# Patient Record
Sex: Female | Born: 1995 | Race: White | Hispanic: No | Marital: Married | Smoking: Current some day smoker
Health system: Southern US, Community
[De-identification: ages and names within clinical notes are randomized; demographics above are authoritative.]

## PROBLEM LIST (undated history)

## (undated) ENCOUNTER — Inpatient Hospital Stay (HOSPITAL_COMMUNITY): Payer: Self-pay

## (undated) DIAGNOSIS — J45909 Unspecified asthma, uncomplicated: Secondary | ICD-10-CM

## (undated) DIAGNOSIS — Z8619 Personal history of other infectious and parasitic diseases: Secondary | ICD-10-CM

## (undated) DIAGNOSIS — R51 Headache: Secondary | ICD-10-CM

## (undated) DIAGNOSIS — F419 Anxiety disorder, unspecified: Secondary | ICD-10-CM

## (undated) DIAGNOSIS — F329 Major depressive disorder, single episode, unspecified: Secondary | ICD-10-CM

## (undated) DIAGNOSIS — F32A Depression, unspecified: Secondary | ICD-10-CM

## (undated) DIAGNOSIS — T7840XA Allergy, unspecified, initial encounter: Secondary | ICD-10-CM

## (undated) DIAGNOSIS — R519 Headache, unspecified: Secondary | ICD-10-CM

## (undated) HISTORY — PX: TONSILLECTOMY: SUR1361

## (undated) HISTORY — DX: Depression, unspecified: F32.A

## (undated) HISTORY — DX: Personal history of other infectious and parasitic diseases: Z86.19

## (undated) HISTORY — DX: Anxiety disorder, unspecified: F41.9

## (undated) HISTORY — DX: Allergy, unspecified, initial encounter: T78.40XA

---
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
## 2017-04-15 ENCOUNTER — Encounter: Payer: Self-pay | Admitting: Emergency Medicine

## 2017-04-15 ENCOUNTER — Emergency Department
Admission: EM | Admit: 2017-04-15 | Discharge: 2017-04-15 | Disposition: A | Discharge status: Home / Self Care | Payer: Medicaid - Out of State | Attending: Emergency Medicine | Admitting: Emergency Medicine

## 2017-04-15 DIAGNOSIS — Z79899 Other long term (current) drug therapy: Secondary | ICD-10-CM | POA: Insufficient documentation

## 2017-04-15 DIAGNOSIS — G43019 Migraine without aura, intractable, without status migrainosus: Secondary | ICD-10-CM | POA: Insufficient documentation

## 2017-04-15 DIAGNOSIS — F17210 Nicotine dependence, cigarettes, uncomplicated: Secondary | ICD-10-CM | POA: Insufficient documentation

## 2017-04-15 DIAGNOSIS — J45909 Unspecified asthma, uncomplicated: Secondary | ICD-10-CM | POA: Insufficient documentation

## 2017-04-15 MED ORDER — KETOROLAC TROMETHAMINE 30 MG/ML IJ SOLN
30.0000 mg | Freq: Once | INTRAMUSCULAR | Status: AC
  Administered 2017-04-15: 30 mg via INTRAVENOUS
  Filled 2017-04-15: qty 1

## 2017-04-15 MED ORDER — ACETAMINOPHEN 325 MG PO TABS
650.0000 mg | ORAL_TABLET | Freq: Once | ORAL | Status: AC
  Administered 2017-04-15: 650 mg via ORAL
  Filled 2017-04-15: qty 2

## 2017-04-15 MED ORDER — PROMETHAZINE HCL 25 MG/ML IJ SOLN
12.5000 mg | Freq: Once | INTRAMUSCULAR | Status: AC
  Administered 2017-04-15: 12.5 mg via INTRAVENOUS
  Filled 2017-04-15: qty 1

## 2017-04-15 MED ORDER — SODIUM CHLORIDE 0.9 % IV BOLUS (SEPSIS)
1000.0000 mL | Freq: Once | INTRAVENOUS | Status: AC
  Administered 2017-04-15: 1000 mL via INTRAVENOUS

## 2017-04-15 NOTE — ED Triage Notes (Signed)
Pt here for migraine that started two days ago.  Hx of migraines per pt but is not seen by neurology. Used to be bad in high school.  Pt reports has come to ED before for same. Photophobia and nausea.

## 2017-04-15 NOTE — ED Provider Notes (Signed)
[facility] Emergency Department Provider Note  ____________________________________________  Time seen: Approximately 1:08 PM  I have reviewed the triage vital signs and the nursing notes.   HISTORY  Chief Complaint Migraine    HPI Karen Huang is a 21 y.o. female that presents to the emergency department for evaluation of a migraine. Migraine started 2 days ago. Pain wraps around the entire head. She woke up with light sensitivity and nausea this morning. She has taken Advil and ibuprofen and states that the Advil helped.She has been to the emergency department before for migraines and states that she usually gets fluids, which helps. She is concerned that she is dehydrated. No shortness breath, chest pain, vomiting, abdominal pain.   Past Medical History:  Diagnosis Date  . Asthma     There are no active problems to display for this patient.   Past Surgical History:  Procedure Laterality Date  . TONSILLECTOMY      Prior to Admission medications   Medication Sig Start Date End Date Taking? Authorizing Provider  albuterol (PROVENTIL HFA;VENTOLIN HFA) 108 (90 Base) MCG/ACT inhaler Inhale 1-2 puffs into the lungs every 6 (six) hours as needed for wheezing or shortness of breath. 11/10/16   Long, Arlyss RepressJoshua G, MD  benzonatate (TESSALON) 100 MG capsule Take 1 capsule (100 mg total) by mouth every 8 (eight) hours. 11/10/16   Long, Arlyss RepressJoshua G, MD  ibuprofen (ADVIL,MOTRIN) 200 MG tablet Take 200 mg by mouth every 6 (six) hours as needed for moderate pain.    [provider]  ibuprofen (ADVIL,MOTRIN) 800 MG tablet Take 1 tablet (800 mg total) by mouth 3 (three) times daily. 11/10/16   Long, Arlyss RepressJoshua G, MD  metroNIDAZOLE (FLAGYL) 500 MG tablet Take 1 tablet (500 mg total) by mouth 2 (two) times daily. 09/28/16   Rasch, Harolyn RutherfordJennifer I, NP    Allergies Amoxicillin  History reviewed. No pertinent family history.  Social History Social History  Substance Use  Topics  . Smoking status: Current Some Day Smoker  . Smokeless tobacco: Never Used  . Alcohol use No     Review of Systems  Constitutional: No fever/chills Cardiovascular: No chest pain. Respiratory:  No SOB. Gastrointestinal: No abdominal pain.  No nausea, no vomiting.  Musculoskeletal: Negative for musculoskeletal pain. Skin: Negative for rash, abrasions, lacerations, ecchymosis. Neurological: Negative for numbness or tingling   ____________________________________________   PHYSICAL EXAM:  VITAL SIGNS: ED Triage Vitals  Enc Vitals Group     BP 04/15/17 1253 103/69     Pulse Rate 04/15/17 1253 96     Resp 04/15/17 1253 14     Temp 04/15/17 1253 98.5 F (36.9 C)     Temp Source 04/15/17 1253 Oral     SpO2 04/15/17 1253 99 %     Weight 04/15/17 1250 125 lb (56.7 kg)     Height 04/15/17 1250 5\' 5"  (1.651 m)     Head Circumference --      Peak Flow --      Pain Score 04/15/17 1250 8     Pain Loc --      Pain Edu? --      Excl. in GC? --      Constitutional: Alert and oriented. Well appearing and in no acute distress. Eyes: Conjunctivae are normal. PERRL. EOMI. Head: Atraumatic. ENT:      Ears:       Nose: No congestion/rhinnorhea.      Mouth/Throat: Mucous membranes are moist.  Neck: No stridor.  Cardiovascular: Normal rate, regular rhythm.  Good peripheral circulation. Respiratory: Normal respiratory effort without tachypnea or retractions. Lungs CTAB. Good air entry to the bases with no decreased or absent breath sounds. Musculoskeletal: Full range of motion to all extremities. No gross deformities appreciated. Neurologic:  Normal speech and language. No gross focal neurologic deficits are appreciated. Strength 5/5 in upper and lower extremities. No dysarthria or expressive aphasia Skin:  Skin is warm, dry and intact. No rash noted.   ____________________________________________   LABS (all labs ordered are listed, but only abnormal results are  displayed)  Labs Reviewed - No data to display ____________________________________________  EKG   ____________________________________________  RADIOLOGY  No results found.  ____________________________________________    PROCEDURES  Procedure(s) performed:    Procedures    Medications  sodium chloride 0.9 % bolus 1,000 mL (0 mLs Intravenous Stopped 04/15/17 1537)  ketorolac (TORADOL) 30 MG/ML injection 30 mg (30 mg Intravenous Given 04/15/17 1359)  promethazine (PHENERGAN) injection 12.5 mg (12.5 mg Intravenous Given 04/15/17 1400)  acetaminophen (TYLENOL) tablet 650 mg (650 mg Oral Given 04/15/17 1445)     ____________________________________________   INITIAL IMPRESSION / ASSESSMENT AND PLAN / ED COURSE  Pertinent labs & imaging results that were available during my care of the patient were reviewed by me and considered in my medical decision making (see chart for details).  Review of the [place] CSRS was performed in accordance of the [facility] prior to dispensing any controlled drugs.   Patient presented to the emergency department for evaluation of a migraine. Vital signs and exam are reassuring. Migraines are similar to previous migraines. She was given Toradol, Phenergan, IV fluids. Patient stated that migraine almost completely resolved with Toradol. She states that she feels so much better and feels like she can sleep now. Patient is to follow up with PCP as directed. Patient is given ED precautions to return to the ED for any worsening or new symptoms.     ____________________________________________  FINAL CLINICAL IMPRESSION(S) / ED DIAGNOSES  Final diagnoses:  Intractable migraine without aura and without status migrainosus      NEW MEDICATIONS STARTED DURING THIS VISIT:  Discharge Medication List as of 04/15/2017  3:26 PM          This chart was dictated using voice recognition software/Dragon. Despite best efforts to proofread, errors can occur  which can change the meaning. Any change was purely unintentional.    Enid Derry, PA-C 04/15/17 1804    Minna Antis, MD 04/16/17 [phone]

## 2017-09-05 NOTE — L&D Delivery Note (Signed)
Delivery Note Pt complete with uncontrollable urge to push. She pushed for just under an hour and at 2:32 PM a viable female was delivered via Vaginal, Spontaneous (Presentation:LOA; compound left arm ;  ).  APGAR: 8,9 ; weight pending  .   Placenta status: delivered minutes later, spontaneous, schultz , .  Cord:  with the following complications:none .  Cord pH: n/a  Anesthesia:  Epidural Episiotomy: Median Lacerations: 2nd degree Suture Repair: 2.0 vicryl Est. Blood Loss (mL): 96  Mom to postpartum.  Baby to Couplet care / Skin to Skin.  Cathrine Muster 06/21/2018, 2:50 PM

## 2017-11-18 ENCOUNTER — Encounter (HOSPITAL_COMMUNITY): Payer: Self-pay

## 2017-11-18 ENCOUNTER — Other Ambulatory Visit: Payer: Self-pay

## 2017-11-18 ENCOUNTER — Emergency Department (HOSPITAL_COMMUNITY)
Admission: EM | Admit: 2017-11-18 | Discharge: 2017-11-18 | Disposition: A | Discharge status: Home / Self Care | Payer: Medicaid - Out of State | Attending: Emergency Medicine | Admitting: Emergency Medicine

## 2017-11-18 ENCOUNTER — Emergency Department (HOSPITAL_COMMUNITY): Payer: Medicaid - Out of State

## 2017-11-18 DIAGNOSIS — Z720 Tobacco use: Secondary | ICD-10-CM | POA: Insufficient documentation

## 2017-11-18 DIAGNOSIS — O209 Hemorrhage in early pregnancy, unspecified: Secondary | ICD-10-CM | POA: Diagnosis present

## 2017-11-18 DIAGNOSIS — O200 Threatened abortion: Secondary | ICD-10-CM | POA: Insufficient documentation

## 2017-11-18 DIAGNOSIS — Z79899 Other long term (current) drug therapy: Secondary | ICD-10-CM | POA: Insufficient documentation

## 2017-11-18 DIAGNOSIS — J45909 Unspecified asthma, uncomplicated: Secondary | ICD-10-CM | POA: Diagnosis not present

## 2017-11-18 DIAGNOSIS — Z3A08 8 weeks gestation of pregnancy: Secondary | ICD-10-CM | POA: Diagnosis not present

## 2017-11-18 LAB — HCG, QUANTITATIVE, PREGNANCY: hCG, Beta Chain, Quant, S: 150090 m[IU]/mL — ABNORMAL HIGH (ref <5)

## 2017-11-18 NOTE — ED Triage Notes (Signed)
Pt presents to the ed with complaints of vaginal bleeding and lower abdominal pain x 1 day. Reports being [redacted] weeks pregnant and having concerns for miscarriage.

## 2017-11-18 NOTE — ED Notes (Signed)
Pt to ultrasound

## 2017-11-18 NOTE — ED Notes (Addendum)
Food and drink given too pt. Called [facility] and they advised she will be next give them 30 mins.

## 2017-11-20 NOTE — ED Provider Notes (Signed)
[facility] Provider Note   CSN: [id number] Arrival date & time: 11/18/17  1311     History   Chief Complaint Chief Complaint  Patient presents with  . Vaginal Bleeding    HPI Roylene Reasonnnastaisha Acoff is a 22 y.o. female.  HPI   22 year old female with vaginal bleeding.  She reports that she is approximately [redacted] weeks pregnant by dates.  Yesterday she began having some left flank and left lower back pain.  Bright red bleeding and some small clots.  No abdominal pain.  No dizziness or lightheadedness.  She has not had any prenatal care at this point.  This is her first pregnancy.  Past Medical History:  Diagnosis Date  . Asthma     There are no active problems to display for this patient.   Past Surgical History:  Procedure Laterality Date  . TONSILLECTOMY      OB History    Gravida Para Term Preterm AB Living   1 0 0 0 0 0   SAB TAB Ectopic Multiple Live Births   0 0 0 0 0       Home Medications    Prior to Admission medications   Medication Sig Start Date End Date Taking? Authorizing Provider  ibuprofen (ADVIL,MOTRIN) 200 MG tablet Take 200-400 mg by mouth every 6 (six) hours as needed for mild pain or moderate pain.    Yes [provider]  Prenatal Vit w/Fe-Methylfol-FA (PNV PO) Take 1 tablet by mouth daily.   Yes [provider]  albuterol (PROVENTIL HFA;VENTOLIN HFA) 108 (90 Base) MCG/ACT inhaler Inhale 1-2 puffs into the lungs every 6 (six) hours as needed for wheezing or shortness of breath. Patient not taking: Reported on 11/18/2017 11/10/16   Long, Arlyss RepressJoshua G, MD  benzonatate (TESSALON) 100 MG capsule Take 1 capsule (100 mg total) by mouth every 8 (eight) hours. Patient not taking: Reported on 11/18/2017 11/10/16   Long, Arlyss RepressJoshua G, MD  ibuprofen (ADVIL,MOTRIN) 800 MG tablet Take 1 tablet (800 mg total) by mouth 3 (three) times daily. Patient not taking: Reported on 11/18/2017 11/10/16   Long, Arlyss RepressJoshua G, MD    metroNIDAZOLE (FLAGYL) 500 MG tablet Take 1 tablet (500 mg total) by mouth 2 (two) times daily. Patient not taking: Reported on 11/18/2017 09/28/16   Rasch, Harolyn RutherfordJennifer I, NP    Family History No family history on file.  Social History Social History   Tobacco Use  . Smoking status: Current Some Day Smoker  . Smokeless tobacco: Never Used  Substance Use Topics  . Alcohol use: No  . Drug use: No     Allergies   Amoxicillin   Review of Systems Review of Systems   Physical Exam Updated Vital Signs BP 122/70 (BP Location: Right Arm)   Pulse 80   Temp 98.8 F (37.1 C) (Oral)   Resp 18   Wt 56.7 kg (125 lb)   LMP 04/06/2017   SpO2 98%   BMI 20.80 kg/m   Physical Exam  Constitutional: She appears well-developed and well-nourished. No distress.  HENT:  Head: Normocephalic and atraumatic.  Eyes: Conjunctivae are normal. Right eye exhibits no discharge. Left eye exhibits no discharge.  Neck: Neck supple.  Cardiovascular: Normal rate, regular rhythm and normal heart sounds. Exam reveals no gallop and no friction rub.  No murmur heard. Pulmonary/Chest: Effort normal and breath sounds normal. No respiratory distress.  Abdominal: Soft. She exhibits no distension. There is no tenderness.  Musculoskeletal: She exhibits  no edema or tenderness.  Neurological: She is alert.  Skin: Skin is warm and dry.  Psychiatric: She has a normal mood and affect. Her behavior is normal. Thought content normal.  Nursing note and vitals reviewed.    ED Treatments / Results  Labs (all labs ordered are listed, but only abnormal results are displayed) Labs Reviewed  HCG, QUANTITATIVE, PREGNANCY - Abnormal; Notable for the following components:      Result Value   hCG, Beta Chain, Quant, S 150,090 (*)    All other components within normal limits    EKG  EKG Interpretation None       Radiology US Ob Comp Less 14 Wks  Result Date: 11/18/2017 CLINICAL DATA:  22 year old pregnant  female presents with 1 day of right lower quadrant and back pain and vaginal bleeding. Quantitative beta HCG 150,090. EDC by LMP: 07/01/2018, projecting to an expected gestational age of [redacted] weeks 6 days. EXAM: OBSTETRIC <14 WK [place] AND TRANSVAGINAL OB US TECHNIQUE: Both transabdominal and transvaginal ultrasound examinations were performed for complete evaluation of the gestation as well as the maternal uterus, adnexal regions, and pelvic cul-de-sac. Transvaginal technique was performed to assess early pregnancy. COMPARISON:  None. FINDINGS: Intrauterine gestational sac: Single intrauterine gestational sac appears normal in size, shape and position. Yolk sac:  Visualized. Embryo:  Visualized. Cardiac Activity: Visualized. Heart Rate: 143 bpm CRL:  14.6 mm   7 w   6 d                  [place] EDC: 07/01/2018 Subchorionic hemorrhage: Small perigestational bleed in the lower cavity involving less than 30% of the gestational sac circumference. Maternal uterus/adnexae: Anteverted uterus. No uterine fibroids. Right ovary measures 2.5 x 1.5 x 1.7 cm. Left ovary measures 4.1 x 3.3 x 3.7 cm and contains a corpus luteum. Trace free fluid in the pelvis. IMPRESSION: 1. Single living intrauterine gestation at 7 weeks 6 days by crown-rump length, concordant with provided menstrual dating. 2. Small perigestational bleed.  Normal embryonic cardiac activity. 3. No ovarian or adnexal abnormality. Electronically Signed   By: Delbert Phenix M.D.   On: 11/18/2017 20:40   US Ob Transvaginal  Result Date: 11/18/2017 CLINICAL DATA:  22 year old pregnant female presents with 1 day of right lower quadrant and back pain and vaginal bleeding. Quantitative beta HCG 150,090. EDC by LMP: 07/01/2018, projecting to an expected gestational age of [redacted] weeks 6 days. EXAM: OBSTETRIC <14 WK [place] AND TRANSVAGINAL OB US TECHNIQUE: Both transabdominal and transvaginal ultrasound examinations were performed for complete evaluation of the gestation as well as the  maternal uterus, adnexal regions, and pelvic cul-de-sac. Transvaginal technique was performed to assess early pregnancy. COMPARISON:  None. FINDINGS: Intrauterine gestational sac: Single intrauterine gestational sac appears normal in size, shape and position. Yolk sac:  Visualized. Embryo:  Visualized. Cardiac Activity: Visualized. Heart Rate: 143 bpm CRL:  14.6 mm   7 w   6 d                  [place] EDC: 07/01/2018 Subchorionic hemorrhage: Small perigestational bleed in the lower cavity involving less than 30% of the gestational sac circumference. Maternal uterus/adnexae: Anteverted uterus. No uterine fibroids. Right ovary measures 2.5 x 1.5 x 1.7 cm. Left ovary measures 4.1 x 3.3 x 3.7 cm and contains a corpus luteum. Trace free fluid in the pelvis. IMPRESSION: 1. Single living intrauterine gestation at 7 weeks 6 days by crown-rump length, concordant with provided menstrual dating. 2.  Small perigestational bleed.  Normal embryonic cardiac activity. 3. No ovarian or adnexal abnormality. Electronically Signed   By: Delbert Phenix M.D.   On: 11/18/2017 20:40    Procedures Procedures (including critical care time)  Medications Ordered in ED Medications - No data to display   Initial Impression / Assessment and Plan / ED Course  I have reviewed the triage vital signs and the nursing notes.  Pertinent labs & imaging results that were available during my care of the patient were reviewed by me and considered in my medical decision making (see chart for details).     22 year old female with first trimester bleeding.  Ultrasound with viable IUP.  Small peri-gestational bleed.  Return precautions were discussed.  Needs to take prenatals.  He is to establish OB care.  Return precautions discussed.  Final Clinical Impressions(s) / ED Diagnoses   Final diagnoses:  Threatened miscarriage    ED Discharge Orders    None       Raeford Razor, MD 11/20/17 [phone]

## 2017-12-15 DIAGNOSIS — Z368A Encounter for antenatal screening for other genetic defects: Secondary | ICD-10-CM | POA: Diagnosis not present

## 2017-12-15 LAB — OB RESULTS CONSOLE ABO/RH: RH Type: NEGATIVE

## 2017-12-15 LAB — OB RESULTS CONSOLE HEPATITIS B SURFACE ANTIGEN: Hepatitis B Surface Ag: NEGATIVE

## 2017-12-15 LAB — OB RESULTS CONSOLE RUBELLA ANTIBODY, IGM: Rubella: IMMUNE

## 2017-12-15 LAB — OB RESULTS CONSOLE GC/CHLAMYDIA
Chlamydia: NEGATIVE
Gonorrhea: NEGATIVE

## 2017-12-15 LAB — OB RESULTS CONSOLE RPR: RPR: NONREACTIVE

## 2017-12-15 LAB — OB RESULTS CONSOLE HIV ANTIBODY (ROUTINE TESTING): HIV: NONREACTIVE

## 2017-12-15 LAB — OB RESULTS CONSOLE ANTIBODY SCREEN: ANTIBODY SCREEN: NEGATIVE

## 2017-12-20 ENCOUNTER — Encounter (HOSPITAL_COMMUNITY): Payer: Self-pay

## 2017-12-20 ENCOUNTER — Inpatient Hospital Stay (HOSPITAL_COMMUNITY)
Admission: AD | Admit: 2017-12-20 | Discharge: 2017-12-21 | Disposition: A | Discharge status: Home / Self Care | Payer: Medicaid - Out of State | Source: Ambulatory Visit | Attending: Obstetrics and Gynecology | Admitting: Obstetrics and Gynecology

## 2017-12-20 DIAGNOSIS — O99331 Smoking (tobacco) complicating pregnancy, first trimester: Secondary | ICD-10-CM | POA: Diagnosis not present

## 2017-12-20 DIAGNOSIS — Z6791 Unspecified blood type, Rh negative: Secondary | ICD-10-CM

## 2017-12-20 DIAGNOSIS — O209 Hemorrhage in early pregnancy, unspecified: Secondary | ICD-10-CM

## 2017-12-20 DIAGNOSIS — Z3A12 12 weeks gestation of pregnancy: Secondary | ICD-10-CM | POA: Diagnosis not present

## 2017-12-20 DIAGNOSIS — Z79899 Other long term (current) drug therapy: Secondary | ICD-10-CM | POA: Insufficient documentation

## 2017-12-20 DIAGNOSIS — O26891 Other specified pregnancy related conditions, first trimester: Secondary | ICD-10-CM

## 2017-12-20 HISTORY — DX: Headache: R51

## 2017-12-20 HISTORY — DX: Headache, unspecified: R51.9

## 2017-12-20 LAB — URINALYSIS, ROUTINE W REFLEX MICROSCOPIC
Bacteria, UA: NONE SEEN
Bilirubin Urine: NEGATIVE
GLUCOSE, UA: NEGATIVE mg/dL
Ketones, ur: NEGATIVE mg/dL
LEUKOCYTES UA: NEGATIVE
Nitrite: NEGATIVE
PH: 6 (ref 5.0–8.0)
Protein, ur: NEGATIVE mg/dL
RBC / HPF: NONE SEEN RBC/hpf (ref 0–5)
SPECIFIC GRAVITY, URINE: 1.012 (ref 1.005–1.030)

## 2017-12-20 MED ORDER — RHO D IMMUNE GLOBULIN 1500 UNIT/2ML IJ SOSY
300.0000 ug | PREFILLED_SYRINGE | Freq: Once | INTRAMUSCULAR | Status: AC
  Administered 2017-12-21: 300 ug via INTRAMUSCULAR
  Filled 2017-12-20: qty 2

## 2017-12-20 NOTE — MAU Note (Signed)
Pt reports a lot of vaginal bleeding prior to coming in. States she noticed a lot of blood in the toilet-has eased since. Pt is reporting some lower abdominal cramping that started last night. Pt states she did not take anything for it because was not that bad. Pt states she last had intercourse 2 days ago.

## 2017-12-21 DIAGNOSIS — O209 Hemorrhage in early pregnancy, unspecified: Secondary | ICD-10-CM

## 2017-12-21 NOTE — MAU Provider Note (Signed)
Chief Complaint: Vaginal Bleeding   First Provider Initiated Contact with Patient 12/20/17 2259        SUBJECTIVE HPI: Karen Huang is a 22 y.o. G1P0000 at [redacted]w[redacted]d by LMP who presents to maternity admissions reporting Bleeding.  .Also has some cramping since last night.  Called office but states no one called her back.  She denies vaginal itching/burning, urinary symptoms, h/a, dizziness, n/v, or fever/chills.  Was seen in ED for bleeding on 11/18/17.  Had a single live IUP then.   Vaginal Bleeding  The patient's primary symptoms include pelvic pain and vaginal bleeding. The patient's pertinent negatives include no genital itching, genital lesions or genital odor. This is a new problem. The current episode started yesterday. The problem occurs intermittently. The problem has been unchanged. The pain is mild. She is pregnant. Associated symptoms include abdominal pain. Pertinent negatives include no chills, constipation, diarrhea, fever, nausea or vomiting. The vaginal discharge was bloody. The vaginal bleeding is lighter than menses. She has not been passing clots. She has not been passing tissue. Nothing aggravates the symptoms. She has tried nothing for the symptoms. She is sexually active.   RN Note: Pt reports a lot of vaginal bleeding prior to coming in. States she noticed a lot of blood in the toilet-has eased since. Pt is reporting some lower abdominal cramping that started last night. Pt states she did not take anything for it because was not that bad. Pt states she last had intercourse 2 days ago    Past Medical History:  Diagnosis Date  . Asthma   . Headache    Past Surgical History:  Procedure Laterality Date  . NO PAST SURGERIES    . TONSILLECTOMY     Social History   Socioeconomic History  . Marital status: Single    Spouse name: Not on file  . Number of children: Not on file  . Years of education: Not on file  . Highest education level: Not on file  Occupational  History  . Not on file  Social Needs  . Financial resource strain: Not on file  . Food insecurity:    Worry: Not on file    Inability: Not on file  . Transportation needs:    Medical: Not on file    Non-medical: Not on file  Tobacco Use  . Smoking status: Current Some Day Smoker  . Smokeless tobacco: Never Used  Substance and Sexual Activity  . Alcohol use: No  . Drug use: No  . Sexual activity: Yes    Birth control/protection: None  Lifestyle  . Physical activity:    Days per week: Not on file    Minutes per session: Not on file  . Stress: Not on file  Relationships  . Social connections:    Talks on phone: Not on file    Gets together: Not on file    Attends religious service: Not on file    Active member of club or organization: Not on file    Attends meetings of clubs or organizations: Not on file    Relationship status: Not on file  . Intimate partner violence:    Fear of current or ex partner: Not on file    Emotionally abused: Not on file    Physically abused: Not on file    Forced sexual activity: Not on file  Other Topics Concern  . Not on file  Social History Narrative  . Not on file   No current facility-administered medications on  file prior to encounter.    Current Outpatient Medications on File Prior to Encounter  Medication Sig Dispense Refill  . albuterol (PROVENTIL HFA;VENTOLIN HFA) 108 (90 Base) MCG/ACT inhaler Inhale 1-2 puffs into the lungs every 6 (six) hours as needed for wheezing or shortness of breath. 1 Inhaler 0  . dimenhyDRINATE (DRAMAMINE) 50 MG tablet Take 50 mg by mouth every 8 (eight) hours as needed.    . Prenatal Vit w/Fe-Methylfol-FA (PNV PO) Take 1 tablet by mouth daily.    . benzonatate (TESSALON) 100 MG capsule Take 1 capsule (100 mg total) by mouth every 8 (eight) hours. (Patient not taking: Reported on 11/18/2017) 21 capsule 0  . ibuprofen (ADVIL,MOTRIN) 200 MG tablet Take 200-400 mg by mouth every 6 (six) hours as needed for mild  pain or moderate pain.     Marland Kitchen ibuprofen (ADVIL,MOTRIN) 800 MG tablet Take 1 tablet (800 mg total) by mouth 3 (three) times daily. (Patient not taking: Reported on 11/18/2017) 21 tablet 0  . metroNIDAZOLE (FLAGYL) 500 MG tablet Take 1 tablet (500 mg total) by mouth 2 (two) times daily. (Patient not taking: Reported on 11/18/2017) 14 tablet 0   Allergies  Allergen Reactions  . Amoxicillin Rash    Allergy is from childhood; was taken to the hospital by her mother    I have reviewed patient's Past Medical Hx, Surgical Hx, Family Hx, Social Hx, medications and allergies.   ROS:  Review of Systems  Constitutional: Negative for chills and fever.  Gastrointestinal: Positive for abdominal pain. Negative for constipation, diarrhea, nausea and vomiting.  Genitourinary: Positive for pelvic pain and vaginal bleeding.   Review of Systems  Other systems negative   Physical Exam  Physical Exam Patient Vitals for the past 24 hrs:  BP Temp Pulse Resp SpO2 Height Weight  12/20/17 2234 131/63 98.1 F (36.7 C) 81 18 100 % - -  12/20/17 2229 - - - - - 5\' 5"  (1.651 m) 127 lb (57.6 kg)   Constitutional: Well-developed, well-nourished female in no acute distress.  Cardiovascular: normal rate Respiratory: normal effort GI: Abd soft, non-tender. Pos BS x 4 MS: Extremities nontender, no edema, normal ROM Neurologic: Alert and oriented x 4.  GU: Neg CVAT.  PELVIC EXAM: Cervix pink, visually closed, without lesion, small to moderate clotted dark red discharge, vaginal walls and external genitalia normal Bimanual exam: Cervix 0/long/high, firm, anterior, neg CMT, uterus nontender, nonenlarged, adnexa without tenderness, enlargement, or mass  FHT 175 by doppler  LAB RESULTS Results for orders placed or performed during the hospital encounter of 12/20/17 (from the past 24 hour(s))  Urinalysis, Routine w reflex microscopic     Status: Abnormal   Collection Time: 12/20/17 10:26 PM  Result Value Ref Range    Color, Urine STRAW (A) YELLOW   APPearance CLEAR CLEAR   Specific Gravity, Urine 1.012 1.005 - 1.030   pH 6.0 5.0 - 8.0   Glucose, UA NEGATIVE NEGATIVE mg/dL   Hgb urine dipstick LARGE (A) NEGATIVE   Bilirubin Urine NEGATIVE NEGATIVE   Ketones, ur NEGATIVE NEGATIVE mg/dL   Protein, ur NEGATIVE NEGATIVE mg/dL   Nitrite NEGATIVE NEGATIVE   Leukocytes, UA NEGATIVE NEGATIVE   RBC / HPF NONE SEEN 0 - 5 RBC/hpf   WBC, UA 0-5 0 - 5 WBC/hpf   Bacteria, UA NONE SEEN NONE SEEN   Squamous Epithelial / LPF 0-5 (A) NONE SEEN   Mucus PRESENT   Rh IG workup (includes ABO/Rh)     Status: None (  Preliminary result)   Collection Time: 12/20/17 11:40 PM  Result Value Ref Range   Gestational Age(Wks) 12    ABO/RH(D) A NEG    Antibody Screen NEG    Unit Number [id number]    Blood Component Type RHIG    Unit division 00    Status of Unit ISSUED    Transfusion Status      OK TO TRANSFUSE Performed at [facility], [address] [place], [place] [address]     --/--/A NEG (04/17 2340)  IMAGING Informal bedside US done for patient reassurance Fetus is quite active, with HR 160s. Normal appearing gestational sac.Marland Kitchen. Ovaries not visualized  MAU Management/MDM: Dr Senaida Oresichardson consulted. Reviewed exam findings and Fetal status Rhophylac ordered due to Rh Neg Reassured pt that baby seems to be well Discussed pelvic rest  ASSESSMENT 1. Bleeding in early pregnancy   2. Rh negative status during pregnancy in first trimester, antepartum     PLAN Discharge home Pelvic rest Followup in office as scheduled  Follow-up Information    Huel Coteichardson, Kathy, MD Follow up.   Specialty:  Obstetrics and Gynecology Contact information: [address] [place] [address] [phone]          Pt stable at time of discharge. Encouraged to return here or to other Urgent Care/ED if she develops worsening of symptoms, increase in pain, fever, or other concerning symptoms.    Wynelle BourgeoisMarie  Williams CNM, MSN Certified Nurse-Midwife 12/21/2017  12:55 AM

## 2017-12-21 NOTE — Discharge Instructions (Signed)
Pelvic Rest °Pelvic rest may be recommended if: °· Your placenta is partially or completely covering the opening of your cervix (placenta previa). °· There is bleeding between the wall of the uterus and the amniotic sac in the first trimester of pregnancy (subchorionic hemorrhage). °· You went into labor too early (preterm labor). ° °Based on your overall health and the health of your baby, your health care provider will decide if pelvic rest is right for you. °How do I rest my pelvis? °For as long as told by your health care provider: °· Do not have sex, sexual stimulation, or an orgasm. °· Do not use tampons. Do not douche. Do not put anything in your vagina. °· Do not lift anything that is heavier than 10 lb (4.5 kg). °· Avoid activities that take a lot of effort (are strenuous). °· Avoid any activity in which your pelvic muscles could become strained. ° °When should I seek medical care? °Seek medical care if you have: °· Cramping pain in your lower abdomen. °· Vaginal discharge. °· A low, dull backache. °· Regular contractions. °· Uterine tightening. ° °When should I seek immediate medical care? °Seek immediate medical care if: °· You have vaginal bleeding and you are pregnant. ° °This information is not intended to replace advice given to you by your health care provider. Make sure you discuss any questions you have with your health care provider. °Document Released: 12/17/2010 Document Revised: 01/28/2016 Document Reviewed: 02/23/2015 °Elsevier Interactive Patient Education © 2018 [employer] ° °Vaginal Bleeding During Pregnancy, First Trimester °A small amount of bleeding (spotting) from the vagina is common in early pregnancy. Sometimes the bleeding is normal and is not a problem, and sometimes it is a sign of something serious. Be sure to tell your doctor about any bleeding from your vagina right away. °Follow these instructions at home: °· Watch your condition for any changes. °· Follow your doctor's  instructions about how active you can be. °· If you are on bed rest: °? You may need to stay in bed and only get up to use the bathroom. °? You may be allowed to do some activities. °? If you need help, make plans for someone to help you. °· Write down: °? The number of pads you use each day. °? How often you change pads. °? How soaked (saturated) your pads are. °· Do not use tampons. °· Do not douche. °· Do not have sex or orgasms until your doctor says it is okay. °· If you pass any tissue from your vagina, save the tissue so you can show it to your doctor. °· Only take medicines as told by your doctor. °· Do not take aspirin because it can make you bleed. °· Keep all follow-up visits as told by your doctor. °Contact a doctor if: °· You bleed from your vagina. °· You have cramps. °· You have labor pains. °· You have a fever that does not go away after you take medicine. °Get help right away if: °· You have very bad cramps in your back or belly (abdomen). °· You pass large clots or tissue from your vagina. °· You bleed more. °· You feel light-headed or weak. °· You pass out (faint). °· You have chills. °· You are leaking fluid or have a gush of fluid from your vagina. °· You pass out while pooping (having a bowel movement). °This information is not intended to replace advice given to you by your health care provider. Make sure   you discuss any questions you have with your health care provider. °Document Released: 01/06/2014 Document Revised: 01/28/2016 Document Reviewed: 04/29/2013 °Elsevier Interactive Patient Education © 2018 [employer] ° °

## 2017-12-22 LAB — RH IG WORKUP (INCLUDES ABO/RH)
ABO/RH(D): A NEG
ANTIBODY SCREEN: NEGATIVE
Gestational Age(Wks): 12
Unit division: 0

## 2018-04-11 DIAGNOSIS — Z3403 Encounter for supervision of normal first pregnancy, third trimester: Secondary | ICD-10-CM | POA: Diagnosis not present

## 2018-04-11 DIAGNOSIS — Z23 Encounter for immunization: Secondary | ICD-10-CM | POA: Diagnosis not present

## 2018-04-11 DIAGNOSIS — Z3A28 28 weeks gestation of pregnancy: Secondary | ICD-10-CM | POA: Diagnosis not present

## 2018-04-11 DIAGNOSIS — Z6791 Unspecified blood type, Rh negative: Secondary | ICD-10-CM | POA: Diagnosis not present

## 2018-04-11 DIAGNOSIS — Z3689 Encounter for other specified antenatal screening: Secondary | ICD-10-CM | POA: Diagnosis not present

## 2018-04-25 DIAGNOSIS — N762 Acute vulvitis: Secondary | ICD-10-CM | POA: Diagnosis not present

## 2018-04-25 DIAGNOSIS — L918 Other hypertrophic disorders of the skin: Secondary | ICD-10-CM | POA: Diagnosis not present

## 2018-04-25 DIAGNOSIS — Z3A30 30 weeks gestation of pregnancy: Secondary | ICD-10-CM | POA: Diagnosis not present

## 2018-04-25 DIAGNOSIS — Z3403 Encounter for supervision of normal first pregnancy, third trimester: Secondary | ICD-10-CM | POA: Diagnosis not present

## 2018-05-11 DIAGNOSIS — Z23 Encounter for immunization: Secondary | ICD-10-CM | POA: Diagnosis not present

## 2018-05-11 DIAGNOSIS — Z3A32 32 weeks gestation of pregnancy: Secondary | ICD-10-CM | POA: Diagnosis not present

## 2018-06-07 DIAGNOSIS — Z3403 Encounter for supervision of normal first pregnancy, third trimester: Secondary | ICD-10-CM | POA: Diagnosis not present

## 2018-06-07 DIAGNOSIS — Z3685 Encounter for antenatal screening for Streptococcus B: Secondary | ICD-10-CM | POA: Diagnosis not present

## 2018-06-07 DIAGNOSIS — Z113 Encounter for screening for infections with a predominantly sexual mode of transmission: Secondary | ICD-10-CM | POA: Diagnosis not present

## 2018-06-07 DIAGNOSIS — Z3A36 36 weeks gestation of pregnancy: Secondary | ICD-10-CM | POA: Diagnosis not present

## 2018-06-07 LAB — OB RESULTS CONSOLE GBS: STREP GROUP B AG: NEGATIVE

## 2018-06-17 ENCOUNTER — Inpatient Hospital Stay (HOSPITAL_COMMUNITY)
Admission: AD | Admit: 2018-06-17 | Discharge: 2018-06-18 | Disposition: A | Discharge status: Home / Self Care | Payer: Medicaid Other | Source: Ambulatory Visit | Attending: Obstetrics and Gynecology | Admitting: Obstetrics and Gynecology

## 2018-06-17 ENCOUNTER — Encounter (HOSPITAL_COMMUNITY): Payer: Self-pay | Admitting: *Deleted

## 2018-06-17 DIAGNOSIS — O26893 Other specified pregnancy related conditions, third trimester: Secondary | ICD-10-CM | POA: Diagnosis not present

## 2018-06-17 DIAGNOSIS — F17200 Nicotine dependence, unspecified, uncomplicated: Secondary | ICD-10-CM | POA: Insufficient documentation

## 2018-06-17 DIAGNOSIS — R030 Elevated blood-pressure reading, without diagnosis of hypertension: Secondary | ICD-10-CM | POA: Insufficient documentation

## 2018-06-17 DIAGNOSIS — Z3A38 38 weeks gestation of pregnancy: Secondary | ICD-10-CM | POA: Insufficient documentation

## 2018-06-17 DIAGNOSIS — Z3689 Encounter for other specified antenatal screening: Secondary | ICD-10-CM

## 2018-06-17 DIAGNOSIS — O4703 False labor before 37 completed weeks of gestation, third trimester: Secondary | ICD-10-CM | POA: Diagnosis not present

## 2018-06-17 DIAGNOSIS — O163 Unspecified maternal hypertension, third trimester: Secondary | ICD-10-CM

## 2018-06-17 DIAGNOSIS — Z880 Allergy status to penicillin: Secondary | ICD-10-CM | POA: Insufficient documentation

## 2018-06-17 DIAGNOSIS — O99333 Smoking (tobacco) complicating pregnancy, third trimester: Secondary | ICD-10-CM | POA: Insufficient documentation

## 2018-06-17 LAB — URINALYSIS, ROUTINE W REFLEX MICROSCOPIC
Bilirubin Urine: NEGATIVE
GLUCOSE, UA: NEGATIVE mg/dL
Ketones, ur: NEGATIVE mg/dL
Nitrite: NEGATIVE
PH: 7 (ref 5.0–8.0)
Protein, ur: NEGATIVE mg/dL
Specific Gravity, Urine: 1.004 — ABNORMAL LOW (ref 1.005–1.030)

## 2018-06-17 LAB — CBC
HCT: 34.6 % — ABNORMAL LOW (ref 36.0–46.0)
HEMOGLOBIN: 12.2 g/dL (ref 12.0–15.0)
MCH: 31.4 pg (ref 26.0–34.0)
MCHC: 35.3 g/dL (ref 30.0–36.0)
MCV: 88.9 fL (ref 80.0–100.0)
Platelets: 114 10*3/uL — ABNORMAL LOW (ref 150–400)
RBC: 3.89 MIL/uL (ref 3.87–5.11)
RDW: 13.4 % (ref 11.5–15.5)
WBC: 10 10*3/uL (ref 4.0–10.5)
nRBC: 0 % (ref 0.0–0.2)

## 2018-06-17 LAB — COMPREHENSIVE METABOLIC PANEL
ALBUMIN: 3.2 g/dL — AB (ref 3.5–5.0)
ALT: 31 U/L (ref 0–44)
ANION GAP: 8 (ref 5–15)
AST: 31 U/L (ref 15–41)
Alkaline Phosphatase: 171 U/L — ABNORMAL HIGH (ref 38–126)
BILIRUBIN TOTAL: 0.3 mg/dL (ref 0.3–1.2)
BUN: 14 mg/dL (ref 6–20)
CHLORIDE: 105 mmol/L (ref 98–111)
CO2: 23 mmol/L (ref 22–32)
Calcium: 9.7 mg/dL (ref 8.9–10.3)
Creatinine, Ser: 0.77 mg/dL (ref 0.44–1.00)
GFR calc Af Amer: >60 mL/min (ref >60)
GLUCOSE: 85 mg/dL (ref 70–99)
POTASSIUM: 4 mmol/L (ref 3.5–5.1)
SODIUM: 136 mmol/L (ref 135–145)
TOTAL PROTEIN: 6.6 g/dL (ref 6.5–8.1)

## 2018-06-17 NOTE — MAU Note (Signed)
Contractions for about an hour  Or two hours

## 2018-06-18 DIAGNOSIS — O139 Gestational [pregnancy-induced] hypertension without significant proteinuria, unspecified trimester: Secondary | ICD-10-CM | POA: Diagnosis not present

## 2018-06-18 DIAGNOSIS — O4703 False labor before 37 completed weeks of gestation, third trimester: Secondary | ICD-10-CM

## 2018-06-18 DIAGNOSIS — Z3A38 38 weeks gestation of pregnancy: Secondary | ICD-10-CM

## 2018-06-18 LAB — PROTEIN / CREATININE RATIO, URINE
Creatinine, Urine: 38 mg/dL
Total Protein, Urine: <6 mg/dL

## 2018-06-18 LAB — RAPID URINE DRUG SCREEN, HOSP PERFORMED
AMPHETAMINES: NOT DETECTED
BENZODIAZEPINES: NOT DETECTED
Barbiturates: NOT DETECTED
Cocaine: NOT DETECTED
OPIATES: NOT DETECTED
TETRAHYDROCANNABINOL: NOT DETECTED

## 2018-06-18 NOTE — Discharge Instructions (Signed)

## 2018-06-18 NOTE — MAU Provider Note (Signed)
History     CSN: [id number]  Arrival date and time: 06/17/18 2131   First Provider Initiated Contact with Patient 06/17/18 2301      Chief Complaint  Patient presents with  . Contractions   HPI  Karen Huang is a 22 y.o. G1P0000 at [redacted]w[redacted]d who presents to MAU for labor check and subsequently developed elevated blood pressure while being evaluated in MAU. Patient endorses painful abdominal contractions 6/10, bilateral low abdomen, onset within the past two hours. Denies vaginal bleeding, leaking of fluid, decreased fetal movement, fever, falls, or recent illness.    OB History    Gravida  1   Para  0   Term  0   Preterm  0   AB  0   Living  0     SAB  0   TAB  0   Ectopic  0   Multiple  0   Live Births  0           Past Medical History:  Diagnosis Date  . Asthma   . Headache     Past Surgical History:  Procedure Laterality Date  . NO PAST SURGERIES    . TONSILLECTOMY      History reviewed. No pertinent family history.  Social History   Tobacco Use  . Smoking status: Current Some Day Smoker  . Smokeless tobacco: Never Used  Substance Use Topics  . Alcohol use: No  . Drug use: No    Allergies:  Allergies  Allergen Reactions  . Amoxicillin Rash    Allergy is from childhood; was taken to the hospital by her mother    No medications prior to admission.    Review of Systems  Constitutional: Positive for chills and fatigue. Negative for fever.  Respiratory: Negative for chest tightness and shortness of breath.   Gastrointestinal: Positive for abdominal pain. Negative for nausea and vomiting.  Genitourinary: Negative for difficulty urinating, dyspareunia, dysuria, vaginal bleeding, vaginal discharge and vaginal pain.  Musculoskeletal: Negative for back pain.  Neurological: Negative for syncope, weakness and headaches.  All other systems reviewed and are negative.  Physical Exam   Blood pressure 130/77, pulse 74, temperature 98 F  (36.7 C), temperature source Oral, resp. rate 18, height 5' 5.5" (1.664 m), weight 73.3 kg, SpO2 98 %.  Physical Exam  Nursing note and vitals reviewed. Constitutional: She is oriented to person, place, and time. She appears well-developed and well-nourished.  Cardiovascular: Normal rate.  GI: There is no tenderness. There is no CVA tenderness.  Gravid  Genitourinary: Vagina normal and uterus normal.  Genitourinary Comments: Cervix unchanged from clinic 3/thick/ballotable  Neurological: She is alert and oriented to person, place, and time. She has normal reflexes.  Skin: Skin is warm and dry.  Psychiatric: She has a normal mood and affect. Her behavior is normal. Judgment and thought content normal.    MAU Course  Procedures  MDM  New onset elevated BP Discussed possible admission with Dr. Mindi Slicker prior to collection of Preeclampsia labs Reactive fetal tracing: baseline 140, moderate variability, positive accelerations, no decelerations Toco: irregular mild contractions q 3-10 min Cervix rechecked by Provider just before discharge, no change from clinic or previous checks in MAU  Patient Vitals for the past 24 hrs:  BP Temp Temp src Pulse Resp SpO2 Height Weight  06/18/18 0111 - 98 F (36.7 C) Oral - - - - -  06/18/18 0100 130/77 - - 74 18 - - -  06/18/18 0047 121/76 - - Marland Kitchen)  107 - - - -  06/18/18 0030 131/83 - - 89 - - - -  06/18/18 0015 135/81 - - 88 - - - -  06/18/18 0000 140/85 - - 91 - - - -  06/17/18 2345 (!) 148/92 - - 84 - - - -  06/17/18 2331 127/88 - - 78 - - - -  06/17/18 2301 127/75 - - 79 - - - -  06/17/18 2245 (!) 142/89 - - 86 - - - -  06/17/18 2230 132/85 - - 83 - - - -  06/17/18 2220 134/79 - - 82 - - - -  06/17/18 2201 133/88 - - 83 - - - -  06/17/18 2155 (!) 143/87 - - 88 - - - -  06/17/18 2150 - - - - - 98 % - -  06/17/18 2146 (!) 147/102 98.5 F (36.9 C) Oral (!) 102 18 - 5' 5.5" (1.664 m) 73.3 kg    Results for orders placed or performed during the  hospital encounter of 06/17/18 (from the past 24 hour(s))  Protein / creatinine ratio, urine     Status: None   Collection Time: 06/17/18 11:01 PM  Result Value Ref Range   Creatinine, Urine 38.00 mg/dL   Total Protein, Urine <6 mg/dL   Protein Creatinine Ratio        0.00 - 0.15 mg/mg[Cre]  Urine rapid drug screen (hosp performed)     Status: None   Collection Time: 06/17/18 11:01 PM  Result Value Ref Range   Opiates NONE DETECTED NONE DETECTED   Cocaine NONE DETECTED NONE DETECTED   Benzodiazepines NONE DETECTED NONE DETECTED   Amphetamines NONE DETECTED NONE DETECTED   Tetrahydrocannabinol NONE DETECTED NONE DETECTED   Barbiturates NONE DETECTED NONE DETECTED  CBC     Status: Abnormal   Collection Time: 06/17/18 11:04 PM  Result Value Ref Range   WBC 10.0 4.0 - 10.5 K/uL   RBC 3.89 3.87 - 5.11 MIL/uL   Hemoglobin 12.2 12.0 - 15.0 g/dL   HCT 10.2 (L) 72.5 - 36.6 %   MCV 88.9 80.0 - 100.0 fL   MCH 31.4 26.0 - 34.0 pg   MCHC 35.3 30.0 - 36.0 g/dL   RDW 44.0 34.7 - 42.5 %   Platelets 114 (L) 150 - 400 K/uL   nRBC 0.0 0.0 - 0.2 %  Comprehensive metabolic panel     Status: Abnormal   Collection Time: 06/17/18 11:04 PM  Result Value Ref Range   Sodium 136 135 - 145 mmol/L   Potassium 4.0 3.5 - 5.1 mmol/L   Chloride 105 98 - 111 mmol/L   CO2 23 22 - 32 mmol/L   Glucose, Bld 85 70 - 99 mg/dL   BUN 14 6 - 20 mg/dL   Creatinine, Ser 9.56 0.44 - 1.00 mg/dL   Calcium 9.7 8.9 - 38.7 mg/dL   Total Protein 6.6 6.5 - 8.1 g/dL   Albumin 3.2 (L) 3.5 - 5.0 g/dL   AST 31 15 - 41 U/L   ALT 31 0 - 44 U/L   Alkaline Phosphatase 171 (H) 38 - 126 U/L   Total Bilirubin 0.3 0.3 - 1.2 mg/dL   GFR calc non Af Amer >60 >60 mL/min   GFR calc Af Amer >60 >60 mL/min   Anion gap 8 5 - 15  Urinalysis, Routine w reflex microscopic     Status: Abnormal   Collection Time: 06/17/18 11:15 PM  Result Value Ref Range   Color, Urine  STRAW (A) YELLOW   APPearance CLEAR CLEAR   Specific Gravity, Urine  1.004 (L) 1.005 - 1.030   pH 7.0 5.0 - 8.0   Glucose, UA NEGATIVE NEGATIVE mg/dL   Hgb urine dipstick LARGE (A) NEGATIVE   Bilirubin Urine NEGATIVE NEGATIVE   Ketones, ur NEGATIVE NEGATIVE mg/dL   Protein, ur NEGATIVE NEGATIVE mg/dL   Nitrite NEGATIVE NEGATIVE   Leukocytes, UA TRACE (A) NEGATIVE   RBC / HPF 0-5 0 - 5 RBC/hpf   WBC, UA 0-5 0 - 5 WBC/hpf   Bacteria, UA RARE (A) NONE SEEN   Squamous Epithelial / LPF 0-5 0 - 5     Assessment and Plan  --22 y.o. G1P0000 at [redacted]w[redacted]d  --Reactive fetal tracing --No cervical change, not laboring --Elevated BP, normal PEC labs --Reviewed general obstetric precautions including but not limited to falls, fever, vaginal bleeding, leaking of fluid, decreased fetal movement, headache not relieved by Tylenol, rest and PO hydration. --Discharge home in stable condition  F/U: Per Dr. Mindi Slicker, patient to report to clinic Monday 06/18/18 for BP assessment and discussion of scheduled IOL  Calvert Cantor, CNM 06/18/2018, 6:35 AM

## 2018-06-19 ENCOUNTER — Encounter (HOSPITAL_COMMUNITY): Payer: Self-pay | Admitting: *Deleted

## 2018-06-19 ENCOUNTER — Telehealth (HOSPITAL_COMMUNITY): Payer: Self-pay | Admitting: *Deleted

## 2018-06-19 NOTE — Telephone Encounter (Signed)
Preadmission screen  

## 2018-06-20 NOTE — H&P (Signed)
Karen Huang is a 22 y.o. prime female presenting at 108 4/[redacted]wks gestationfor scheduled iol due to [facility]. Pt is dated per LMP which was confirmed with a 7 week [place]. She had negative genetic screening. Prenatal care essentiall uncomplicated till past week when BP noted to be elevated. Her platelets were subsequently noted to be decreased to 114K but on repeat were 132K. Her BP stayed elevated today hence plan for iol for PIH. Pt is currently asymptomatic. GBS is negative. Cervix is favorable  OB History    Gravida  1   Para  0   Term  0   Preterm  0   AB  0   Living  0     SAB  0   TAB  0   Ectopic  0   Multiple  0   Live Births  0          Past Medical History:  Diagnosis Date  . Asthma   . Headache   . Hx of chlamydia infection    age 80   Past Surgical History:  Procedure Laterality Date  . TONSILLECTOMY     Family History: family history includes Hypertension in her father, paternal grandmother, and sister. Social History:  reports that she quit smoking about 7 months ago. She has never used smokeless tobacco. She reports that she does not drink alcohol or use drugs.     Maternal Diabetes: No Genetic Screening: Normal Maternal Ultrasounds/Referrals: Normal Fetal Ultrasounds or other Referrals:  None Maternal Substance Abuse:  No Significant Maternal Medications:  None Significant Maternal Lab Results:  Lab values include: Group B Strep negative; VZNI, Rh neg Other Comments:  None  ROS History   Last menstrual period 09/24/2017. Exam Physical Exam  Prenatal labs: ABO, Rh: --/--/A NEG (04/17 2340) Antibody: NEG (04/17 2340) Rubella: Immune (04/12 0000) RPR: Nonreactive (04/12 0000)  HBsAg: Negative (04/12 0000)  HIV: Non-reactive (04/12 0000)  GBS: Negative (10/03 0000)   Assessment/Plan: 22yo G1 presenting at 85 4/[redacted]wks gestation for iol due to [facility]Re[facility] labs  GBS neg Pain control prn AROM then pitocin if indicated Hydralazine protocol and  MgSO4 if indicated Anticipate svd  Jeannene Tschetter W Shianne Zeiser 06/20/2018, 6:02 PM

## 2018-06-21 ENCOUNTER — Other Ambulatory Visit: Payer: Self-pay

## 2018-06-21 ENCOUNTER — Inpatient Hospital Stay (HOSPITAL_COMMUNITY): Payer: Medicaid Other | Admitting: Anesthesiology

## 2018-06-21 ENCOUNTER — Encounter (HOSPITAL_COMMUNITY): Payer: Self-pay

## 2018-06-21 ENCOUNTER — Inpatient Hospital Stay (HOSPITAL_COMMUNITY)
Admission: RE | Admit: 2018-06-21 | Discharge: 2018-06-23 | DRG: 807 | Disposition: A | Discharge status: Home / Self Care | Payer: Medicaid Other | Attending: Obstetrics and Gynecology | Admitting: Obstetrics and Gynecology

## 2018-06-21 DIAGNOSIS — O26893 Other specified pregnancy related conditions, third trimester: Secondary | ICD-10-CM | POA: Diagnosis not present

## 2018-06-21 DIAGNOSIS — O9952 Diseases of the respiratory system complicating childbirth: Secondary | ICD-10-CM | POA: Diagnosis not present

## 2018-06-21 DIAGNOSIS — O134 Gestational [pregnancy-induced] hypertension without significant proteinuria, complicating childbirth: Principal | ICD-10-CM | POA: Diagnosis present

## 2018-06-21 DIAGNOSIS — J45909 Unspecified asthma, uncomplicated: Secondary | ICD-10-CM | POA: Diagnosis present

## 2018-06-21 DIAGNOSIS — Z880 Allergy status to penicillin: Secondary | ICD-10-CM | POA: Diagnosis not present

## 2018-06-21 DIAGNOSIS — Z6791 Unspecified blood type, Rh negative: Secondary | ICD-10-CM

## 2018-06-21 DIAGNOSIS — Z3A38 38 weeks gestation of pregnancy: Secondary | ICD-10-CM

## 2018-06-21 DIAGNOSIS — Z87891 Personal history of nicotine dependence: Secondary | ICD-10-CM | POA: Diagnosis not present

## 2018-06-21 DIAGNOSIS — O133 Gestational [pregnancy-induced] hypertension without significant proteinuria, third trimester: Secondary | ICD-10-CM | POA: Diagnosis present

## 2018-06-21 LAB — HEPATIC FUNCTION PANEL
ALT: 30 U/L (ref 0–44)
AST: 33 U/L (ref 15–41)
Albumin: 3.2 g/dL — ABNORMAL LOW (ref 3.5–5.0)
Alkaline Phosphatase: 171 U/L — ABNORMAL HIGH (ref 38–126)
BILIRUBIN TOTAL: 0.5 mg/dL (ref 0.3–1.2)
Bilirubin, Direct: <0.1 mg/dL (ref 0.0–0.2)
Total Protein: 7.2 g/dL (ref 6.5–8.1)

## 2018-06-21 LAB — CBC
HCT: 35.3 % — ABNORMAL LOW (ref 36.0–46.0)
HEMATOCRIT: 37.3 % (ref 36.0–46.0)
HEMOGLOBIN: 13.1 g/dL (ref 12.0–15.0)
HEMOGLOBIN: 12.4 g/dL (ref 12.0–15.0)
MCH: 31.3 pg (ref 26.0–34.0)
MCH: 31.4 pg (ref 26.0–34.0)
MCHC: 35.1 g/dL (ref 30.0–36.0)
MCHC: 35.1 g/dL (ref 30.0–36.0)
MCV: 89.2 fL (ref 80.0–100.0)
MCV: 89.4 fL (ref 80.0–100.0)
PLATELETS: 121 10*3/uL — AB (ref 150–400)
Platelets: 117 10*3/uL — ABNORMAL LOW (ref 150–400)
RBC: 4.18 MIL/uL (ref 3.87–5.11)
RBC: 3.95 MIL/uL (ref 3.87–5.11)
RDW: 13.8 % (ref 11.5–15.5)
RDW: 13.7 % (ref 11.5–15.5)
WBC: 8.6 10*3/uL (ref 4.0–10.5)
WBC: 18.9 10*3/uL — ABNORMAL HIGH (ref 4.0–10.5)
nRBC: 0 % (ref 0.0–0.2)
nRBC: 0 % (ref 0.0–0.2)

## 2018-06-21 LAB — TYPE AND SCREEN
ABO/RH(D): A NEG
ANTIBODY SCREEN: NEGATIVE

## 2018-06-21 LAB — PROTEIN / CREATININE RATIO, URINE
CREATININE, URINE: 50 mg/dL
Protein Creatinine Ratio: 0.16 mg/mg{Cre} — ABNORMAL HIGH (ref 0.00–0.15)
Total Protein, Urine: 8 mg/dL

## 2018-06-21 LAB — RPR: RPR Ser Ql: NONREACTIVE

## 2018-06-21 LAB — URIC ACID: Uric Acid, Serum: 9.6 mg/dL — ABNORMAL HIGH (ref 2.5–7.1)

## 2018-06-21 LAB — LACTATE DEHYDROGENASE: LDH: 158 U/L (ref 98–192)

## 2018-06-21 MED ORDER — PHENYLEPHRINE 40 MCG/ML (10ML) SYRINGE FOR IV PUSH (FOR BLOOD PRESSURE SUPPORT)
80.0000 ug | PREFILLED_SYRINGE | INTRAVENOUS | Status: DC | PRN
  Filled 2018-06-21: qty 5
  Filled 2018-06-21: qty 10

## 2018-06-21 MED ORDER — SENNOSIDES-DOCUSATE SODIUM 8.6-50 MG PO TABS
2.0000 | ORAL_TABLET | ORAL | Status: DC
  Administered 2018-06-23: 2 via ORAL
  Filled 2018-06-21: qty 2

## 2018-06-21 MED ORDER — LIDOCAINE HCL (PF) 1 % IJ SOLN
30.0000 mL | INTRAMUSCULAR | Status: DC | PRN
  Filled 2018-06-21: qty 30

## 2018-06-21 MED ORDER — TERBUTALINE SULFATE 1 MG/ML IJ SOLN
0.2500 mg | Freq: Once | INTRAMUSCULAR | Status: DC | PRN
  Filled 2018-06-21: qty 1

## 2018-06-21 MED ORDER — ONDANSETRON HCL 4 MG/2ML IJ SOLN: 4.0000 mg | INTRAMUSCULAR | Status: DC | PRN

## 2018-06-21 MED ORDER — ACETAMINOPHEN 325 MG PO TABS
650.0000 mg | ORAL_TABLET | ORAL | Status: DC | PRN
  Administered 2018-06-22 – 2018-06-23 (×2): 650 mg via ORAL
  Filled 2018-06-21: qty 2

## 2018-06-21 MED ORDER — DIPHENHYDRAMINE HCL 25 MG PO CAPS: 25.0000 mg | ORAL_CAPSULE | Freq: Four times a day (QID) | ORAL | Status: DC | PRN

## 2018-06-21 MED ORDER — DIBUCAINE 1 % RE OINT: 1.0000 "application " | TOPICAL_OINTMENT | RECTAL | Status: DC | PRN

## 2018-06-21 MED ORDER — ACETAMINOPHEN 325 MG PO TABS
650.0000 mg | ORAL_TABLET | ORAL | Status: DC | PRN
  Filled 2018-06-21: qty 2

## 2018-06-21 MED ORDER — BENZOCAINE-MENTHOL 20-0.5 % EX AERO
1.0000 "application " | INHALATION_SPRAY | CUTANEOUS | Status: DC | PRN
  Administered 2018-06-21 – 2018-06-23 (×2): 1 via TOPICAL
  Filled 2018-06-21 (×2): qty 56

## 2018-06-21 MED ORDER — LABETALOL HCL 5 MG/ML IV SOLN: 40.0000 mg | INTRAVENOUS | Status: DC | PRN

## 2018-06-21 MED ORDER — ONDANSETRON HCL 4 MG/2ML IJ SOLN: 4.0000 mg | Freq: Four times a day (QID) | INTRAMUSCULAR | Status: DC | PRN

## 2018-06-21 MED ORDER — FENTANYL 2.5 MCG/ML BUPIVACAINE 1/10 % EPIDURAL INFUSION (WH - ANES)
14.0000 mL/h | INTRAMUSCULAR | Status: DC | PRN
  Administered 2018-06-21: 14 mL/h via EPIDURAL
  Filled 2018-06-21: qty 100

## 2018-06-21 MED ORDER — SOD CITRATE-CITRIC ACID 500-334 MG/5ML PO SOLN
30.0000 mL | ORAL | Status: DC | PRN
  Administered 2018-06-21: 30 mL via ORAL
  Filled 2018-06-21: qty 15

## 2018-06-21 MED ORDER — IBUPROFEN 600 MG PO TABS
600.0000 mg | ORAL_TABLET | Freq: Four times a day (QID) | ORAL | Status: DC
  Administered 2018-06-21 – 2018-06-23 (×6): 600 mg via ORAL
  Filled 2018-06-21 (×6): qty 1

## 2018-06-21 MED ORDER — HYDRALAZINE HCL 20 MG/ML IJ SOLN: 10.0000 mg | INTRAMUSCULAR | Status: DC | PRN

## 2018-06-21 MED ORDER — SIMETHICONE 80 MG PO CHEW: 80.0000 mg | CHEWABLE_TABLET | ORAL | Status: DC | PRN

## 2018-06-21 MED ORDER — OXYCODONE-ACETAMINOPHEN 5-325 MG PO TABS: 2.0000 | ORAL_TABLET | ORAL | Status: DC | PRN

## 2018-06-21 MED ORDER — WITCH HAZEL-GLYCERIN EX PADS: 1.0000 "application " | MEDICATED_PAD | CUTANEOUS | Status: DC | PRN

## 2018-06-21 MED ORDER — ONDANSETRON HCL 4 MG PO TABS: 4.0000 mg | ORAL_TABLET | ORAL | Status: DC | PRN

## 2018-06-21 MED ORDER — OXYCODONE HCL 5 MG PO TABS: 10.0000 mg | ORAL_TABLET | ORAL | Status: DC | PRN

## 2018-06-21 MED ORDER — LACTATED RINGERS IV SOLN: 500.0000 mL | Freq: Once | INTRAVENOUS | Status: DC

## 2018-06-21 MED ORDER — TETANUS-DIPHTH-ACELL PERTUSSIS 5-2.5-18.5 LF-MCG/0.5 IM SUSP: 0.5000 mL | Freq: Once | INTRAMUSCULAR | Status: DC

## 2018-06-21 MED ORDER — EPHEDRINE 5 MG/ML INJ
10.0000 mg | INTRAVENOUS | Status: DC | PRN
  Filled 2018-06-21: qty 2

## 2018-06-21 MED ORDER — OXYTOCIN 40 UNITS IN LACTATED RINGERS INFUSION - SIMPLE MED: 2.5000 [IU]/h | INTRAVENOUS | Status: DC

## 2018-06-21 MED ORDER — OXYTOCIN 40 UNITS IN LACTATED RINGERS INFUSION - SIMPLE MED
1.0000 m[IU]/min | INTRAVENOUS | Status: DC
  Administered 2018-06-21: 2 m[IU]/min via INTRAVENOUS
  Filled 2018-06-21: qty 1000

## 2018-06-21 MED ORDER — BUTORPHANOL TARTRATE 1 MG/ML IJ SOLN
1.0000 mg | INTRAMUSCULAR | Status: DC | PRN
  Administered 2018-06-21: 1 mg via INTRAVENOUS
  Filled 2018-06-21: qty 1

## 2018-06-21 MED ORDER — LABETALOL HCL 5 MG/ML IV SOLN: 20.0000 mg | INTRAVENOUS | Status: DC | PRN

## 2018-06-21 MED ORDER — LIDOCAINE HCL (PF) 1 % IJ SOLN
INTRAMUSCULAR | Status: DC | PRN
  Administered 2018-06-21 (×2): 4 mL via EPIDURAL

## 2018-06-21 MED ORDER — DIPHENHYDRAMINE HCL 50 MG/ML IJ SOLN: 12.5000 mg | INTRAMUSCULAR | Status: DC | PRN

## 2018-06-21 MED ORDER — HYDRALAZINE HCL 20 MG/ML IJ SOLN: 5.0000 mg | INTRAMUSCULAR | Status: DC | PRN

## 2018-06-21 MED ORDER — LACTATED RINGERS IV SOLN
INTRAVENOUS | Status: DC
  Administered 2018-06-21 (×2): via INTRAVENOUS

## 2018-06-21 MED ORDER — COCONUT OIL OIL: 1.0000 "application " | TOPICAL_OIL | Status: DC | PRN

## 2018-06-21 MED ORDER — ALBUTEROL SULFATE (2.5 MG/3ML) 0.083% IN NEBU: 3.0000 mL | INHALATION_SOLUTION | Freq: Four times a day (QID) | RESPIRATORY_TRACT | Status: DC | PRN

## 2018-06-21 MED ORDER — PRENATAL MULTIVITAMIN CH
1.0000 | ORAL_TABLET | Freq: Every day | ORAL | Status: DC
  Administered 2018-06-22: 1 via ORAL
  Filled 2018-06-21: qty 1

## 2018-06-21 MED ORDER — LACTATED RINGERS IV SOLN: 500.0000 mL | INTRAVENOUS | Status: DC | PRN

## 2018-06-21 MED ORDER — OXYCODONE HCL 5 MG PO TABS: 5.0000 mg | ORAL_TABLET | ORAL | Status: DC | PRN

## 2018-06-21 MED ORDER — ZOLPIDEM TARTRATE 5 MG PO TABS: 5.0000 mg | ORAL_TABLET | Freq: Every evening | ORAL | Status: DC | PRN

## 2018-06-21 MED ORDER — OXYCODONE-ACETAMINOPHEN 5-325 MG PO TABS: 1.0000 | ORAL_TABLET | ORAL | Status: DC | PRN

## 2018-06-21 MED ORDER — OXYTOCIN BOLUS FROM INFUSION
500.0000 mL | Freq: Once | INTRAVENOUS | Status: AC
  Administered 2018-06-21: 500 mL via INTRAVENOUS

## 2018-06-21 MED ORDER — BUPIVACAINE HCL (PF) 0.25 % IJ SOLN
INTRAMUSCULAR | Status: DC | PRN
  Administered 2018-06-21: 6 mL via EPIDURAL

## 2018-06-21 NOTE — Progress Notes (Signed)
Patient ID: Karen Huang, female   DOB: 1996/08/18, 22 y.o.   MRN: [id number] Pt with significant pelvic pressure VS: HR 140s EFM - cat 1, 120 TOCO - contractions q 1-2 mins SVE - 9.5/100/0  A/P: Prime with PIH progressing in labor         Turn on right to decrease rim on that side          Recheck in and push if able          Anticipate svd

## 2018-06-21 NOTE — Progress Notes (Addendum)
Patient ID: Karen Huang, female   DOB: 04-03-1996, 22 y.o.   MRN: [id number] Pt doing well with no complaints. Had some floaters this am. No HA. +Fms VS -127-146/87-109 EFM - cat 1 TOCO - contractions q 3-52mins SVE 4/90/-2  Labs pending  A/P: Prime at term with PIH - for iol         AROM with clear fluid noted         Pit for induction prn         Pain control prn         BP mgmt as indicated

## 2018-06-21 NOTE — Anesthesia Procedure Notes (Signed)
Epidural Patient location during procedure: OB Start time: 06/21/2018 10:15 AM End time: 06/21/2018 11:17 AM  Staffing Anesthesiologist: Kaylyn Layer, MD Performed: anesthesiologist   Preanesthetic Checklist Completed: patient identified, pre-op evaluation, timeout performed, IV checked, risks and benefits discussed and monitors and equipment checked  Epidural Patient position: sitting Prep: site prepped and draped and DuraPrep Patient monitoring: continuous pulse ox, blood pressure, heart rate and cardiac monitor Approach: midline Location: L3-L4 Injection technique: LOR air  Needle:  Needle type: Tuohy  Needle gauge: 17 G Needle length: 9 cm Needle insertion depth: 5 cm Catheter type: closed end flexible Catheter size: 19 Gauge Catheter at skin depth: 10 cm Test dose: negative and Other (1% lidocaine)  Assessment Events: blood not aspirated, injection not painful, no injection resistance, negative IV test and no paresthesia  Additional Notes Patient identified. Risks, benefits, and alternatives discussed with patient including but not limited to bleeding, infection, nerve damage, paralysis, failed block, incomplete pain control, headache, blood pressure changes, nausea, vomiting, reactions to medication, itching, and postpartum back pain. Confirmed with bedside nurse the patient's most recent platelet count. Confirmed with patient that they are not currently taking any anticoagulation, have any bleeding history, or any family history of bleeding disorders. Patient expressed understanding and wished to proceed. All questions were answered. Sterile technique was used throughout the entire procedure. Please see nursing notes for vital signs. Crisp LOR on first pass. Test dose was given through epidural catheter and negative prior to continuing to dose epidural or start infusion. Warning signs of high block given to the patient including shortness of breath, tingling/numbness in  hands, complete motor block, or any concerning symptoms with instructions to call for help. Patient was given instructions on fall risk and not to get out of bed. All questions and concerns addressed with instructions to call with any issues or inadequate analgesia.  Reason for block:procedure for pain

## 2018-06-21 NOTE — Anesthesia Preprocedure Evaluation (Signed)
Anesthesia Evaluation  Patient identified by MRN, date of birth, ID band Patient awake    Reviewed: Allergy & Precautions, Patient's Chart, lab work & pertinent test results  Airway Mallampati: II  TM Distance: >3 FB Neck ROM: Full    Dental no notable dental hx. (+) Teeth Intact   Pulmonary asthma , former smoker,    Pulmonary exam normal breath sounds clear to auscultation       Cardiovascular negative cardio ROS Normal cardiovascular exam Rhythm:Regular Rate:Normal     Neuro/Psych negative neurological ROS  negative psych ROS   GI/Hepatic negative GI ROS, Neg liver ROS,   Endo/Other  negative endocrine ROS  Renal/GU negative Renal ROS  negative genitourinary   Musculoskeletal negative musculoskeletal ROS (+)   Abdominal   Peds negative pediatric ROS (+)  Hematology negative hematology ROS (+)   Anesthesia Other Findings   Reproductive/Obstetrics (+) Pregnancy                             Anesthesia Physical Anesthesia Plan  ASA: II  Anesthesia Plan: Epidural   Post-op Pain Management:    Induction:   PONV Risk Score and Plan: 2 and Treatment may vary due to age or medical condition  Airway Management Planned: Natural Airway  Additional Equipment:   Intra-op Plan:   Post-operative Plan:   Informed Consent: I have reviewed the patients History and Physical, chart, labs and discussed the procedure including the risks, benefits and alternatives for the proposed anesthesia with the patient or authorized representative who has indicated his/her understanding and acceptance.     Plan Discussed with: CRNA  Anesthesia Plan Comments:         Anesthesia Quick Evaluation

## 2018-06-21 NOTE — Anesthesia Pain Management Evaluation Note (Signed)
  CRNA Pain Management Visit Note  Patient: Karen Huang Reason, 22 y.o., female  "Hello I am a member of the anesthesia team at [facility]. We have an anesthesia team available at all times to provide care throughout the hospital, including epidural management and anesthesia for C-section. I don't know your plan for the delivery whether it a natural birth, water birth, IV sedation, nitrous supplementation, doula or epidural, but we want to meet your pain goals."   1.Was your pain managed to your expectations on prior hospitalizations?   Yes   2.What is your expectation for pain management during this hospitalization?     Epidural  3.How can we help you reach that goal? epidural  Record the patient's initial score and the patient's pain goal.   Pain: 3/10  Pain Goal: 0/10 The [facility] wants you to be able to say your pain was always managed very well.  Salome Arnt 06/21/2018

## 2018-06-21 NOTE — Lactation Note (Signed)
This note was copied from a baby's chart. Lactation Consultation Note  Patient Name: Karen Huang Today's Date: 06/21/2018   Spoke to RN Ashley Mariner, and she stated that mom is not interested in pumping and bottle, she just put baby to the breast while waited for bottles of formula, she's strictly bottle feeding formula.  Maternal Data    Feeding Feeding Type: Formula Nipple Type: Slow - flow   Interventions    Lactation Tools Discussed/Used     Consult Status      Karen Huang 06/21/2018, 8:50 PM

## 2018-06-22 LAB — CBC
HEMATOCRIT: 33.7 % — AB (ref 36.0–46.0)
Hemoglobin: 11.8 g/dL — ABNORMAL LOW (ref 12.0–15.0)
MCH: 31.2 pg (ref 26.0–34.0)
MCHC: 35 g/dL (ref 30.0–36.0)
MCV: 89.2 fL (ref 80.0–100.0)
NRBC: 0.1 % (ref 0.0–0.2)
Platelets: 112 10*3/uL — ABNORMAL LOW (ref 150–400)
RBC: 3.78 MIL/uL — AB (ref 3.87–5.11)
RDW: 13.9 % (ref 11.5–15.5)
WBC: 12 10*3/uL — ABNORMAL HIGH (ref 4.0–10.5)

## 2018-06-22 MED ORDER — RHO D IMMUNE GLOBULIN 1500 UNIT/2ML IJ SOSY
300.0000 ug | PREFILLED_SYRINGE | Freq: Once | INTRAMUSCULAR | Status: AC
  Administered 2018-06-22: 300 ug via INTRAMUSCULAR
  Filled 2018-06-22: qty 2

## 2018-06-22 MED ORDER — LABETALOL HCL 100 MG PO TABS
100.0000 mg | ORAL_TABLET | Freq: Two times a day (BID) | ORAL | Status: DC
  Administered 2018-06-22 – 2018-06-23 (×3): 100 mg via ORAL
  Filled 2018-06-22 (×3): qty 1

## 2018-06-22 NOTE — Anesthesia Postprocedure Evaluation (Signed)
Anesthesia Post Note  Patient: Customer service manager  Procedure(s) Performed: AN AD HOC LABOR EPIDURAL     Patient location during evaluation: Mother Baby Anesthesia Type: Epidural Level of consciousness: awake and alert, oriented and patient cooperative Pain management: pain level controlled Vital Signs Assessment: post-procedure vital signs reviewed and stable Respiratory status: spontaneous breathing Cardiovascular status: stable Postop Assessment: no headache, epidural receding, patient able to bend at knees and no signs of nausea or vomiting Anesthetic complications: no Comments: Pain score 1.    Last Vitals:  Vitals:   06/22/18 0340 06/22/18 0625  BP: 130/89 128/87  Pulse: 74 72  Resp:    Temp: 36.8 C 36.7 C  SpO2:      Last Pain:  Vitals:   06/22/18 0735  TempSrc:   PainSc: 0-No pain   Pain Goal: Patients Stated Pain Goal: 4 (06/21/18 1639)               Merrilyn Puma

## 2018-06-22 NOTE — Progress Notes (Addendum)
Post Partum Day 1 Subjective: no complaints, up ad lib and tolerating PO No HA or PIH sx  Objective: Blood pressure 128/87, pulse 72, temperature 98 F (36.7 C), temperature source Oral, resp. rate 20, height 5\' 5"  (1.651 m), weight 71.7 kg, last menstrual period 09/24/2017, SpO2 98 %, unknown if currently breastfeeding.  Physical Exam:  General: alert and cooperative Lochia: appropriate Uterine Fundus: firm   Recent Labs    06/21/18 1630 06/22/18 0536  HGB 12.4 11.8*  HCT 35.3* 33.7*    Assessment/Plan: Plan for discharge tomorrow  BP still a bit elevated, so will start on labetalol 100mg  po BID Bottlefeeding Platelets stable at 112K   LOS: 1 day   Karen Huang 06/22/2018, 9:23 AM

## 2018-06-23 LAB — RH IG WORKUP (INCLUDES ABO/RH)
ABO/RH(D): A NEG
FETAL SCREEN: NEGATIVE
GESTATIONAL AGE(WKS): 38.4
Unit division: 0

## 2018-06-23 MED ORDER — IBUPROFEN 600 MG PO TABS: 600.0000 mg | ORAL_TABLET | Freq: Four times a day (QID) | ORAL | 0 refills | Status: DC

## 2018-06-23 MED ORDER — ACETAMINOPHEN 325 MG PO TABS: 650.0000 mg | ORAL_TABLET | ORAL | 0 refills | Status: DC | PRN

## 2018-06-23 MED ORDER — LABETALOL HCL 100 MG PO TABS: 100.0000 mg | ORAL_TABLET | Freq: Two times a day (BID) | ORAL | 1 refills | Status: DC

## 2018-06-23 NOTE — Discharge Summary (Signed)
OB Discharge Summary     Patient Name: Karen Huang DOB: 11-Oct-1995 MRN: [id number]  Date of admission: 06/21/2018 Delivering MD: Pryor Ochoa [facility]   Date of discharge: 06/23/2018  Admitting diagnosis: INDUCTON Intrauterine pregnancy: [redacted]w[redacted]d     Secondary diagnosis:  Active Problems:   PIH (pregnancy induced hypertension), third trimester   SVD (spontaneous vaginal delivery)   Postpartum care following vaginal delivery  Additional problems: none     Discharge diagnosis: Term Pregnancy Delivered                                                                                                Post partum procedures:antihypertensives  Augmentation: AROM and Pitocin  Complications: None  Hospital course:  Induction of Labor With Vaginal Delivery   22 y.o. yo G1P1001 at [redacted]w[redacted]d was admitted to the hospital 06/21/2018 for induction of labor.  Indication for induction: Gestational hypertension.  Patient had an uncomplicated labor course as follows: Membrane Rupture Time/Date: 8:57 AM ,06/21/2018   Intrapartum Procedures: Episiotomy: Median [2]                                         Lacerations:  None [1]  Patient had delivery of a Viable infant.  Information for the patient's newborn:  Selia, Ware[place] Girl Jeraline [[id number]]  Delivery Method: Vag-Spont   06/21/2018  Details of delivery can be found in separate delivery note.  Patient had a postpartum course compicated by some mildly elevated blood pressures which were controlled with labetalol BID. Patient is discharged home 06/23/18 on medication.  Physical exam  Vitals:   06/22/18 1327 06/22/18 2118 06/23/18 0000 06/23/18 0618  BP: 124/85 136/83 (!) 129/91 131/82  Pulse: 84 79 76 78  Resp: 18 16    Temp: 98.1 F (36.7 C) 98.1 F (36.7 C) 98 F (36.7 C) 98.7 F (37.1 C)  TempSrc: Oral Oral  Oral  SpO2:  95% 100%   Weight:      Height:       General: alert and cooperative Lochia: appropriate Uterine Fundus:  firm  Labs: Lab Results  Component Value Date   WBC 12.0 (H) 06/22/2018   HGB 11.8 (L) 06/22/2018   HCT 33.7 (L) 06/22/2018   MCV 89.2 06/22/2018   PLT 112 (L) 06/22/2018   CMP Latest Ref Rng & Units 06/21/2018  Glucose 70 - 99 mg/dL -  BUN 6 - 20 mg/dL -  Creatinine 5.62 - 1.30 mg/dL -  Sodium 865 - 784 mmol/L -  Potassium 3.5 - 5.1 mmol/L -  Chloride 98 - 111 mmol/L -  CO2 22 - 32 mmol/L -  Calcium 8.9 - 10.3 mg/dL -  Total Protein 6.5 - 8.1 g/dL 7.2  Total Bilirubin 0.3 - 1.2 mg/dL 0.5  Alkaline Phos 38 - 126 U/L 171(H)  AST 15 - 41 U/L 33  ALT 0 - 44 U/L 30    Discharge instruction: per After Visit Summary and "Baby and Me Booklet".  After visit meds:  Allergies  as of 06/23/2018      Reactions   Amoxicillin Rash   Allergy is from childhood; was taken to the hospital by her mother Has patient had a PCN reaction causing immediate rash, facial/tongue/throat swelling, SOB or lightheadedness with hypotension: Unknown Has patient had a PCN reaction causing severe rash involving mucus membranes or skin necrosis: No Has patient had a PCN reaction that required hospitalization: No Has patient had a PCN reaction occurring within the last 10 years: No If all of the above answers are "NO", then may proceed with Cephalos      Medication List    STOP taking these medications   diphenhydrAMINE 25 MG tablet Commonly known as:  BENADRYL   ZANTAC PO     TAKE these medications   acetaminophen 325 MG tablet Commonly known as:  TYLENOL Take 2 tablets (650 mg total) by mouth every 4 (four) hours as needed (for pain scale < 4).   albuterol 108 (90 Base) MCG/ACT inhaler Commonly known as:  PROVENTIL HFA;VENTOLIN HFA Inhale 2 puffs into the lungs every 6 (six) hours as needed for wheezing or shortness of breath.   cyclobenzaprine 10 MG tablet Commonly known as:  FLEXERIL Take 10 mg by mouth daily as needed for muscle spasms.   ibuprofen 600 MG tablet Commonly known as:   ADVIL,MOTRIN Take 1 tablet (600 mg total) by mouth every 6 (six) hours.   labetalol 100 MG tablet Commonly known as:  NORMODYNE Take 1 tablet (100 mg total) by mouth 2 (two) times daily.   prenatal multivitamin Tabs tablet Take 1 tablet by mouth daily at 12 noon.       Diet: routine diet  Activity: Advance as tolerated. Pelvic rest for 6 weeks.   Outpatient follow up:4 days Follow up Appt:No future appointments. Follow up Visit:No follow-ups on file.  Postpartum contraception: IUD Mirena  Newborn Data: Live born female  Birth Weight: 7 lb 3.5 oz (3275 g) APGAR: 8, 9  Newborn Delivery   Birth date/time:  06/21/2018 14:32:00 Delivery type:  Vaginal, Spontaneous     Baby Feeding: Bottle Disposition:home with mother   06/23/2018 Oliver Pila, MD

## 2018-06-23 NOTE — Progress Notes (Signed)
Post Partum Day 2 Subjective: no complaints and tolerating PO.  Feeling fine on the labetalol  Objective: Blood pressure 131/82, pulse 78, temperature 98.7 F (37.1 C), temperature source Oral, resp. rate 16, height 5\' 5"  (1.651 m), weight 71.7 kg, last menstrual period 09/24/2017, SpO2 100 %, unknown if currently breastfeeding.  Physical Exam:  General: alert and cooperative Lochia: appropriate Uterine Fundus: firm   Recent Labs    06/21/18 1630 06/22/18 0536  HGB 12.4 11.8*  HCT 35.3* 33.7*    Assessment/Plan: Discharge home  BP high normal on the labetalol, so will continue and have return to office in 3-4 days for BP check   LOS: 2 days   Karen Huang 06/23/2018, 10:47 AM

## 2018-07-13 DIAGNOSIS — Z1331 Encounter for screening for depression: Secondary | ICD-10-CM | POA: Diagnosis not present

## 2018-08-01 DIAGNOSIS — Z3009 Encounter for other general counseling and advice on contraception: Secondary | ICD-10-CM | POA: Diagnosis not present

## 2018-08-01 DIAGNOSIS — Z1331 Encounter for screening for depression: Secondary | ICD-10-CM | POA: Diagnosis not present

## 2018-08-01 DIAGNOSIS — Z30430 Encounter for insertion of intrauterine contraceptive device: Secondary | ICD-10-CM | POA: Diagnosis not present

## 2018-09-12 DIAGNOSIS — Z30431 Encounter for routine checking of intrauterine contraceptive device: Secondary | ICD-10-CM | POA: Diagnosis not present

## 2018-09-12 DIAGNOSIS — R42 Dizziness and giddiness: Secondary | ICD-10-CM | POA: Diagnosis not present

## 2018-10-16 ENCOUNTER — Emergency Department (HOSPITAL_COMMUNITY)
Admission: EM | Admit: 2018-10-16 | Discharge: 2018-10-16 | Disposition: A | Discharge status: Home / Self Care | Payer: Medicaid Other | Attending: Emergency Medicine | Admitting: Emergency Medicine

## 2018-10-16 ENCOUNTER — Encounter: Payer: Self-pay | Admitting: Emergency Medicine

## 2018-10-16 DIAGNOSIS — Z79899 Other long term (current) drug therapy: Secondary | ICD-10-CM | POA: Insufficient documentation

## 2018-10-16 DIAGNOSIS — R12 Heartburn: Secondary | ICD-10-CM | POA: Diagnosis not present

## 2018-10-16 DIAGNOSIS — J45909 Unspecified asthma, uncomplicated: Secondary | ICD-10-CM | POA: Insufficient documentation

## 2018-10-16 DIAGNOSIS — Z87891 Personal history of nicotine dependence: Secondary | ICD-10-CM | POA: Insufficient documentation

## 2018-10-16 DIAGNOSIS — J4520 Mild intermittent asthma, uncomplicated: Secondary | ICD-10-CM | POA: Diagnosis not present

## 2018-10-16 DIAGNOSIS — R079 Chest pain, unspecified: Secondary | ICD-10-CM | POA: Diagnosis present

## 2018-10-16 MED ORDER — ALUM & MAG HYDROXIDE-SIMETH 200-200-20 MG/5ML PO SUSP
30.0000 mL | Freq: Once | ORAL | Status: AC
  Administered 2018-10-16: 30 mL via ORAL
  Filled 2018-10-16: qty 30

## 2018-10-16 NOTE — ED Notes (Signed)
ED Provider at bedside. 

## 2018-10-16 NOTE — ED Notes (Signed)
Verbalized understanding discharge instructions and follow-up. In no acute distress.   

## 2018-10-16 NOTE — ED Provider Notes (Signed)
[facility]-EMERGENCY DEPT Provider Note  CSN: [id number] Arrival date & time: 10/16/18 0004  Chief Complaint(s) Chest Pain  HPI Karen Huang is a 23 y.o. female   The history is provided by the patient.  Chest Pain  Pain location:  Substernal area (comes from RUQ and LUQ then goes up into chest) Pain quality: burning and pressure   Onset quality:  Gradual Duration:  1 day Timing:  Intermittent Progression:  Waxing and waning Chronicity:  New Relieved by:  Nothing Worsened by:  Nothing Associated symptoms: abdominal pain, anxiety and nausea   Associated symptoms: no cough, no fatigue, no fever and no vomiting     Past Medical History Past Medical History:  Diagnosis Date  . Asthma   . Headache   . Hx of chlamydia infection    age 22  . SVD (spontaneous vaginal delivery) 06/21/2018   Patient Active Problem List   Diagnosis Date Noted  . PIH (pregnancy induced hypertension), third trimester 06/21/2018  . SVD (spontaneous vaginal delivery) 06/21/2018  . Postpartum care following vaginal delivery 06/21/2018   Home Medication(s) Prior to Admission medications   Medication Sig Start Date End Date Taking? Authorizing Provider  acetaminophen (TYLENOL) 325 MG tablet Take 2 tablets (650 mg total) by mouth every 4 (four) hours as needed (for pain scale < 4). 06/23/18   Huel Cote, MD  albuterol (PROVENTIL HFA;VENTOLIN HFA) 108 (90 Base) MCG/ACT inhaler Inhale 2 puffs into the lungs every 6 (six) hours as needed for wheezing or shortness of breath.    [provider]  cyclobenzaprine (FLEXERIL) 10 MG tablet Take 10 mg by mouth daily as needed for muscle spasms.  06/07/18   [provider]  ibuprofen (ADVIL,MOTRIN) 600 MG tablet Take 1 tablet (600 mg total) by mouth every 6 (six) hours. 06/23/18   Huel Cote, MD  labetalol (NORMODYNE) 100 MG tablet Take 1 tablet (100 mg total) by mouth 2 (two) times daily. 06/23/18    Huel Cote, MD  Prenatal Vit-Fe Fumarate-FA (PRENATAL MULTIVITAMIN) TABS tablet Take 1 tablet by mouth daily at 12 noon.    [provider]                                                                                                                                    Past Surgical History Past Surgical History:  Procedure Laterality Date  . TONSILLECTOMY     Family History Family History  Problem Relation Age of Onset  . Hypertension Father   . Hypertension Sister   . Hypertension Paternal Grandmother   . COPD Paternal Grandmother   . Diabetes Paternal Grandmother   . Alcohol abuse Mother   . ADD / ADHD Brother   . COPD Maternal Grandmother     Social History Social History   Tobacco Use  . Smoking status: Former Smoker    Last attempt to quit: 11/17/2017    Years  since quitting: 0.9  . Smokeless tobacco: Never Used  Substance Use Topics  . Alcohol use: No  . Drug use: No   Allergies Amoxicillin  Review of Systems Review of Systems  Constitutional: Negative for fatigue and fever.  Respiratory: Negative for cough.   Cardiovascular: Positive for chest pain.  Gastrointestinal: Positive for abdominal pain and nausea. Negative for vomiting.   All other systems are reviewed and are negative for acute change except as noted in the HPI   Physical Exam Vital Signs  I have reviewed the triage vital signs BP 122/83 (BP Location: Right Arm)   Pulse 85   Temp 98.2 F (36.8 C) (Oral)   Resp 18   Ht 5\' 5"  (1.651 m)   Wt 61.2 kg   SpO2 96%   BMI 22.47 kg/m   Physical Exam Vitals signs reviewed.  Constitutional:      General: She is not in acute distress.    Appearance: She is well-developed. She is not diaphoretic.  HENT:     Head: Normocephalic and atraumatic.     Nose: Nose normal.  Eyes:     General: No scleral icterus.       Right eye: No discharge.        Left eye: No discharge.     Conjunctiva/sclera: Conjunctivae normal.     Pupils:  Pupils are equal, round, and reactive to light.  Neck:     Musculoskeletal: Normal range of motion and neck supple.  Cardiovascular:     Rate and Rhythm: Normal rate and regular rhythm.     Heart sounds: No murmur. No friction rub. No gallop.   Pulmonary:     Effort: Pulmonary effort is normal. No respiratory distress.     Breath sounds: Normal breath sounds. No stridor. No rales.  Abdominal:     General: There is no distension.     Palpations: Abdomen is soft.     Tenderness: There is no abdominal tenderness. There is no guarding or rebound.  Musculoskeletal:        General: No tenderness.  Skin:    General: Skin is warm and dry.     Findings: No erythema or rash.  Neurological:     Mental Status: She is alert and oriented to person, place, and time.     ED Results and Treatments Labs (all labs ordered are listed, but only abnormal results are displayed) Labs Reviewed - No data to display                                                                                                                       EKG  EKG Interpretation  Date/Time:  Tuesday October 16 2018 00:18:45 EST Ventricular Rate:  74 PR Interval:    QRS Duration: 97 QT Interval:  380 QTC Calculation: 422 R Axis:   97 Text Interpretation:  Sinus rhythm Borderline right axis deviation Baseline wander NO STEMI No old tracing to compare Confirmed by Drema Pryardama, Flonnie Wierman [phone])  on 10/16/2018 3:05:12 AM      Radiology No results found. Pertinent labs & imaging results that were available during my care of the patient were reviewed by me and considered in my medical decision making (see chart for details).  Medications Ordered in ED Medications  alum & mag hydroxide-simeth (MAALOX/MYLANTA) 200-200-20 MG/5ML suspension 30 mL (30 mLs Oral Given 10/16/18 0313)                                                                                                                                     Procedures Procedures  (including critical care time)  Medical Decision Making / ED Course I have reviewed the nursing notes for this encounter and the patient's prior records (if available in EHR or on provided paperwork).    Patient presents with symptoms suspicious for acid reflux.  Highly atypical for ACS or other cardiac etiology..  EKG without acute ischemic changes.    Not classic for aortic dissection or esophageal perforation.  Low suspicion for pulmonary embolism.  Abdomen benign and not suspicious or concerning for acute intra-abdominal inflammatory/infectious process.  Treated symptomatically with GI cocktail resulting in near complete resolution of her symptoms.  The patient appears reasonably screened and/or stabilized for discharge and I doubt any other medical condition or other [facility] requiring further screening, evaluation, or treatment in the ED at this time prior to discharge.  The patient is safe for discharge with strict return precautions.   Final Clinical Impression(s) / ED Diagnoses Final diagnoses:  Heartburn    Disposition: Discharge  Condition: Good  I have discussed the results, Dx and Tx plan with the patient who expressed understanding and agree(s) with the plan. Discharge instructions discussed at great length. The patient was given strict return precautions who verbalized understanding of the instructions. No further questions at time of discharge.    ED Discharge Orders    None       This chart was dictated using voice recognition software.  Despite best efforts to proofread,  errors can occur which can change the documentation meaning.   Nira Conn, MD 10/16/18 [phone]

## 2018-10-16 NOTE — ED Triage Notes (Signed)
Pt complains of intermittent chest pain since this afternoon, she states that it feels really heavy when she lays down and she feels like she can't breathe

## 2018-10-17 ENCOUNTER — Other Ambulatory Visit: Payer: Self-pay

## 2018-10-17 ENCOUNTER — Emergency Department (HOSPITAL_COMMUNITY): Payer: Medicaid Other

## 2018-10-17 ENCOUNTER — Emergency Department (HOSPITAL_COMMUNITY)
Admission: EM | Admit: 2018-10-17 | Discharge: 2018-10-17 | Disposition: A | Discharge status: Home / Self Care | Payer: Medicaid Other | Attending: Emergency Medicine | Admitting: Emergency Medicine

## 2018-10-17 ENCOUNTER — Encounter (HOSPITAL_COMMUNITY): Payer: Self-pay | Admitting: Emergency Medicine

## 2018-10-17 DIAGNOSIS — R200 Anesthesia of skin: Secondary | ICD-10-CM

## 2018-10-17 DIAGNOSIS — R51 Headache: Secondary | ICD-10-CM | POA: Diagnosis not present

## 2018-10-17 DIAGNOSIS — R202 Paresthesia of skin: Secondary | ICD-10-CM | POA: Diagnosis not present

## 2018-10-17 DIAGNOSIS — R519 Headache, unspecified: Secondary | ICD-10-CM

## 2018-10-17 DIAGNOSIS — R110 Nausea: Secondary | ICD-10-CM | POA: Diagnosis not present

## 2018-10-17 DIAGNOSIS — Z113 Encounter for screening for infections with a predominantly sexual mode of transmission: Secondary | ICD-10-CM | POA: Diagnosis not present

## 2018-10-17 DIAGNOSIS — R42 Dizziness and giddiness: Secondary | ICD-10-CM | POA: Insufficient documentation

## 2018-10-17 DIAGNOSIS — R1013 Epigastric pain: Secondary | ICD-10-CM | POA: Diagnosis not present

## 2018-10-17 DIAGNOSIS — J45909 Unspecified asthma, uncomplicated: Secondary | ICD-10-CM | POA: Diagnosis not present

## 2018-10-17 DIAGNOSIS — Z1159 Encounter for screening for other viral diseases: Secondary | ICD-10-CM | POA: Diagnosis not present

## 2018-10-17 DIAGNOSIS — Z87891 Personal history of nicotine dependence: Secondary | ICD-10-CM | POA: Diagnosis not present

## 2018-10-17 DIAGNOSIS — R102 Pelvic and perineal pain: Secondary | ICD-10-CM | POA: Diagnosis not present

## 2018-10-17 DIAGNOSIS — Z30432 Encounter for removal of intrauterine contraceptive device: Secondary | ICD-10-CM | POA: Diagnosis not present

## 2018-10-17 LAB — COMPREHENSIVE METABOLIC PANEL
ALBUMIN: 4.7 g/dL (ref 3.5–5.0)
ALT: 17 U/L (ref 0–44)
ANION GAP: 10 (ref 5–15)
AST: 18 U/L (ref 15–41)
Alkaline Phosphatase: 44 U/L (ref 38–126)
BUN: 15 mg/dL (ref 6–20)
CALCIUM: 9.6 mg/dL (ref 8.9–10.3)
CHLORIDE: 105 mmol/L (ref 98–111)
CO2: 22 mmol/L (ref 22–32)
CREATININE: 0.56 mg/dL (ref 0.44–1.00)
GFR calc non Af Amer: >60 mL/min (ref >60)
GLUCOSE: 94 mg/dL (ref 70–99)
Potassium: 3.4 mmol/L — ABNORMAL LOW (ref 3.5–5.1)
SODIUM: 137 mmol/L (ref 135–145)
Total Bilirubin: 0.5 mg/dL (ref 0.3–1.2)
Total Protein: 7.7 g/dL (ref 6.5–8.1)

## 2018-10-17 LAB — CBC WITH DIFFERENTIAL/PLATELET
Abs Immature Granulocytes: 0.01 10*3/uL (ref 0.00–0.07)
BASOS ABS: 0 10*3/uL (ref 0.0–0.1)
BASOS PCT: 1 %
Eosinophils Absolute: 0.1 10*3/uL (ref 0.0–0.5)
Eosinophils Relative: 2 %
HCT: 41.6 % (ref 36.0–46.0)
Hemoglobin: 13.6 g/dL (ref 12.0–15.0)
Immature Granulocytes: 0 %
LYMPHS ABS: 2.5 10*3/uL (ref 0.7–4.0)
Lymphocytes Relative: 39 %
MCH: 28.9 pg (ref 26.0–34.0)
MCHC: 32.7 g/dL (ref 30.0–36.0)
MCV: 88.3 fL (ref 80.0–100.0)
Monocytes Absolute: 0.4 10*3/uL (ref 0.1–1.0)
Monocytes Relative: 7 %
NEUTROS ABS: 3.3 10*3/uL (ref 1.7–7.7)
Neutrophils Relative %: 51 %
PLATELETS: 230 10*3/uL (ref 150–400)
RBC: 4.71 MIL/uL (ref 3.87–5.11)
RDW: 12.6 % (ref 11.5–15.5)
WBC: 6.3 10*3/uL (ref 4.0–10.5)
nRBC: 0 % (ref 0.0–0.2)

## 2018-10-17 LAB — I-STAT BETA HCG BLOOD, ED (MC, WL, AP ONLY): I-stat hCG, quantitative: <5 m[IU]/mL (ref <5)

## 2018-10-17 LAB — I-STAT TROPONIN, ED: Troponin i, poc: 0 ng/mL (ref 0.00–0.08)

## 2018-10-17 LAB — LIPASE, BLOOD: Lipase: 33 U/L (ref 11–51)

## 2018-10-17 MED ORDER — SODIUM CHLORIDE 0.9 % IV BOLUS
1000.0000 mL | Freq: Once | INTRAVENOUS | Status: AC
  Administered 2018-10-17: 1000 mL via INTRAVENOUS

## 2018-10-17 MED ORDER — FAMOTIDINE IN NACL 20-0.9 MG/50ML-% IV SOLN
20.0000 mg | Freq: Once | INTRAVENOUS | Status: AC
  Administered 2018-10-17: 20 mg via INTRAVENOUS
  Filled 2018-10-17: qty 50

## 2018-10-17 MED ORDER — ONDANSETRON HCL 4 MG/2ML IJ SOLN
4.0000 mg | Freq: Once | INTRAMUSCULAR | Status: AC
  Administered 2018-10-17: 4 mg via INTRAVENOUS
  Filled 2018-10-17: qty 2

## 2018-10-17 MED ORDER — METOCLOPRAMIDE HCL 10 MG PO TABS: 10.0000 mg | ORAL_TABLET | Freq: Four times a day (QID) | ORAL | 0 refills | Status: DC | PRN

## 2018-10-17 NOTE — ED Provider Notes (Addendum)
[facility]-EMERGENCY DEPT Provider Note   CSN: [id number] Arrival date & time: 10/17/18  1900     History   Chief Complaint Chief Complaint  Patient presents with  . Nausea  . Dizziness    HPI Karen Huang is a 23 y.o. female history of hepatitis C, recent vaginal delivery 3 months ago here presenting with persistent nausea, normal pain, chest pain, dizziness, numbness.  Patient was seen in the ED yesterday. Patient was thought to have gastritis and was given GI cocktail and felt better.  Patient states that since discharge she has been having dizziness and headache and persistent epigastric pain.  She actually went to her OB doctor earlier today and had a hepatitis panel that was sent.  She denies any fevers but states that she feels numb all over.   The history is provided by the patient.    Past Medical History:  Diagnosis Date  . Asthma   . Headache   . Hx of chlamydia infection    age 50  . SVD (spontaneous vaginal delivery) 06/21/2018    Patient Active Problem List   Diagnosis Date Noted  . PIH (pregnancy induced hypertension), third trimester 06/21/2018  . SVD (spontaneous vaginal delivery) 06/21/2018  . Postpartum care following vaginal delivery 06/21/2018    Past Surgical History:  Procedure Laterality Date  . TONSILLECTOMY       OB History    Gravida  1   Para  1   Term  1   Preterm  0   AB  0   Living  1     SAB  0   TAB  0   Ectopic  0   Multiple  0   Live Births  1            Home Medications    Prior to Admission medications   Medication Sig Start Date End Date Taking? Authorizing Provider  albuterol (PROVENTIL HFA;VENTOLIN HFA) 108 (90 Base) MCG/ACT inhaler Inhale 2 puffs into the lungs every 6 (six) hours as needed for wheezing or shortness of breath.   Yes [provider]  acetaminophen (TYLENOL) 325 MG tablet Take 2 tablets (650 mg total) by mouth every 4 (four) hours as needed (for  pain scale < 4). Patient not taking: Reported on 10/17/2018 06/23/18   Huel Cote, MD  ibuprofen (ADVIL,MOTRIN) 600 MG tablet Take 1 tablet (600 mg total) by mouth every 6 (six) hours. Patient not taking: Reported on 10/17/2018 06/23/18   Huel Cote, MD  labetalol (NORMODYNE) 100 MG tablet Take 1 tablet (100 mg total) by mouth 2 (two) times daily. Patient not taking: Reported on 10/17/2018 06/23/18   Huel Cote, MD  metoCLOPramide (REGLAN) 10 MG tablet Take 1 tablet (10 mg total) by mouth every 6 (six) hours as needed for nausea (nausea/headache). 10/17/18   Charlynne Pander, MD    Family History Family History  Problem Relation Age of Onset  . Hypertension Father   . Hypertension Sister   . Hypertension Paternal Grandmother   . COPD Paternal Grandmother   . Diabetes Paternal Grandmother   . Alcohol abuse Mother   . ADD / ADHD Brother   . COPD Maternal Grandmother     Social History Social History   Tobacco Use  . Smoking status: Former Smoker    Last attempt to quit: 11/17/2017    Years since quitting: 0.9  . Smokeless tobacco: Never Used  Substance Use Topics  . Alcohol use:  No  . Drug use: No     Allergies   Amoxicillin   Review of Systems Review of Systems  Neurological: Positive for dizziness.  All other systems reviewed and are negative.    Physical Exam Updated Vital Signs BP 111/79 (BP Location: Left Arm)   Pulse 80   Temp 98.3 F (36.8 C) (Oral)   Resp 16   Ht 5\' 6"  (1.676 m)   Wt 61.2 kg   SpO2 98%   BMI 21.79 kg/m   Physical Exam Vitals signs and nursing note reviewed.  Constitutional:      Comments: Anxious   HENT:     Head: Normocephalic.     Right Ear: Tympanic membrane normal.     Left Ear: Tympanic membrane normal.     Nose: Nose normal.     Mouth/Throat:     Mouth: Mucous membranes are moist.  Eyes:     Extraocular Movements: Extraocular movements intact.     Pupils: Pupils are equal, round, and reactive to  light.  Neck:     Musculoskeletal: Normal range of motion.  Cardiovascular:     Rate and Rhythm: Normal rate.     Pulses: Normal pulses.     Heart sounds: Normal heart sounds.  Pulmonary:     Effort: Pulmonary effort is normal.     Breath sounds: Normal breath sounds.  Abdominal:     General: Abdomen is flat.     Palpations: Abdomen is soft.     Comments: Minimal epigastric tenderness   Musculoskeletal: Normal range of motion.  Skin:    General: Skin is warm.     Capillary Refill: Capillary refill takes less than 2 seconds.  Neurological:     General: No focal deficit present.     Mental Status: She is alert and oriented to person, place, and time.     Comments: CN 2- 12 intact, no facial droop, nl strength and sensation throughout   Psychiatric:        Mood and Affect: Mood normal.        Behavior: Behavior normal.      ED Treatments / Results  Labs (all labs ordered are listed, but only abnormal results are displayed) Labs Reviewed  COMPREHENSIVE METABOLIC PANEL - Abnormal; Notable for the following components:      Result Value   Potassium 3.4 (*)    All other components within normal limits  CBC WITH DIFFERENTIAL/PLATELET  LIPASE, BLOOD  URINALYSIS, ROUTINE W REFLEX MICROSCOPIC  I-STAT TROPONIN, ED  I-STAT BETA HCG BLOOD, ED ([facility], [facility], [facility] ONLY)    EKG EKG Interpretation  Date/Time:  Wednesday October 17 2018 21:12:12 EST Ventricular Rate:  67 PR Interval:    QRS Duration: 104 QT Interval:  416 QTC Calculation: 440 R Axis:   96 Text Interpretation:  Sinus rhythm Consider RVH w/ secondary repol abnormality No significant change since last tracing Confirmed by Richardean CanalYao, Grasiela Jonsson H [phone]([id number]) on 10/17/2018 9:51:31 PM   Radiology Ct Head Wo Contrast  Result Date: 10/17/2018 CLINICAL DATA:  Dizziness and headache EXAM: CT HEAD WITHOUT CONTRAST TECHNIQUE: Contiguous axial images were obtained from the base of the skull through the vertex without intravenous contrast.  COMPARISON:  None. FINDINGS: BRAIN: The ventricles and sulci are normal. No intraparenchymal hemorrhage, mass effect nor midline shift. No acute large vascular territory infarcts. Grey-white matter distinction is maintained. The basal ganglia are unremarkable. No abnormal extra-axial fluid collections. Basal cisterns are not effaced and midline. The brainstem and  cerebellar hemispheres are without acute abnormalities. VASCULAR: Unremarkable. SKULL/SOFT TISSUES: No skull fracture. No significant soft tissue swelling. ORBITS/SINUSES: The included ocular globes and orbital contents are normal.The mastoid air cells are clear. The included paranasal sinuses are well-aerated. OTHER: None. IMPRESSION: Normal head CT. Electronically Signed   By: Tollie Eth M.D.   On: 10/17/2018 21:55   Dg Abd Acute 2+v W 1v Chest  Result Date: 10/17/2018 CLINICAL DATA:  Epigastric pain, nausea, headache EXAM: DG ABDOMEN ACUTE W/ 1V CHEST COMPARISON:  None. FINDINGS: The bowel gas pattern is normal. There is no evidence of free intraperitoneal air. No suspicious radio-opaque calculi or other significant radiographic abnormality is seen. Heart size and mediastinal contours are within normal limits. Both lungs are clear. Nodular densities project over both lower lobes compatible with nipple shadows. IMPRESSION: Negative abdominal radiographs.  No acute cardiopulmonary disease. Electronically Signed   By: Charlett Nose M.D.   On: 10/17/2018 22:07    Procedures Procedures (including critical care time)  Medications Ordered in ED Medications  sodium chloride 0.9 % bolus 1,000 mL (1,000 mLs Intravenous New Bag/Given 10/17/18 2127)  ondansetron (ZOFRAN) injection 4 mg (4 mg Intravenous Given 10/17/18 2120)  famotidine (PEPCID) IVPB 20 mg premix (20 mg Intravenous New Bag/Given 10/17/18 2128)     Initial Impression / Assessment and Plan / ED Course  I have reviewed the triage vital signs and the nursing notes.  Pertinent labs &  imaging results that were available during my care of the patient were reviewed by me and considered in my medical decision making (see chart for details).    Karen Huang is a 23 y.o. female here with dizziness, headaches, nausea. Appears anxious. Likely gastroenteritis vs migraines. Will get labs, CT head, acute abdominal series. She is concerned for hep C and apparently had hepatitis panel sent earlier today with results pending. Afebrile, well appearing. Will hydrate and reassess.   10:35 PM Labs including LFTs unremarkable. Acute abdominal series unremarkable. CT head unremarkable. Given pepcid, zofran, felt better. She seemed anxious about her symptoms. I reassured her. Will dc home with reglan prn nausea, headaches.    Final Clinical Impressions(s) / ED Diagnoses   Final diagnoses:  Epigastric pain  Nausea  Numbness  Nonintractable headache, unspecified chronicity pattern, unspecified headache type    ED Discharge Orders         Ordered    metoCLOPramide (REGLAN) 10 MG tablet  Every 6 hours PRN     10/17/18 2234           Charlynne Pander, MD 10/17/18 2233    Charlynne Pander, MD 10/17/18 2235

## 2018-10-17 NOTE — Discharge Instructions (Signed)
You likely have a stomach virus. Your labs including liver function testing and CT scan of your head is normal. Stay hydrated. Take reglan as needed for headaches and nausea or vomiting   See your doctor  Return to ER if you have worse abdominal pain, vomiting, weakness, numbness, headaches.

## 2018-10-17 NOTE — ED Triage Notes (Signed)
Pt arriving with complaint of dizziness, headache, and epigastric pain. Pt states her symptoms have been present for approx 3 days. Pt seen 10/16/18 for same. Pt ambulatory.

## 2018-10-18 ENCOUNTER — Encounter: Payer: Self-pay | Admitting: Internal Medicine

## 2018-10-18 DIAGNOSIS — R102 Pelvic and perineal pain: Secondary | ICD-10-CM | POA: Diagnosis not present

## 2018-10-18 DIAGNOSIS — Z1159 Encounter for screening for other viral diseases: Secondary | ICD-10-CM | POA: Diagnosis not present

## 2018-10-19 ENCOUNTER — Encounter: Payer: Self-pay | Admitting: Internal Medicine

## 2018-10-19 DIAGNOSIS — Z8619 Personal history of other infectious and parasitic diseases: Secondary | ICD-10-CM | POA: Diagnosis not present

## 2018-10-22 DIAGNOSIS — F331 Major depressive disorder, recurrent, moderate: Secondary | ICD-10-CM | POA: Diagnosis not present

## 2018-10-22 DIAGNOSIS — F410 Panic disorder [episodic paroxysmal anxiety] without agoraphobia: Secondary | ICD-10-CM | POA: Diagnosis not present

## 2018-10-22 DIAGNOSIS — F411 Generalized anxiety disorder: Secondary | ICD-10-CM | POA: Diagnosis not present

## 2018-10-23 DIAGNOSIS — F411 Generalized anxiety disorder: Secondary | ICD-10-CM | POA: Diagnosis not present

## 2018-10-23 DIAGNOSIS — F410 Panic disorder [episodic paroxysmal anxiety] without agoraphobia: Secondary | ICD-10-CM | POA: Diagnosis not present

## 2018-10-23 DIAGNOSIS — F331 Major depressive disorder, recurrent, moderate: Secondary | ICD-10-CM | POA: Diagnosis not present

## 2018-10-23 DIAGNOSIS — F332 Major depressive disorder, recurrent severe without psychotic features: Secondary | ICD-10-CM | POA: Diagnosis not present

## 2018-10-29 DIAGNOSIS — F331 Major depressive disorder, recurrent, moderate: Secondary | ICD-10-CM | POA: Diagnosis not present

## 2018-10-29 DIAGNOSIS — F411 Generalized anxiety disorder: Secondary | ICD-10-CM | POA: Diagnosis not present

## 2018-10-29 DIAGNOSIS — F410 Panic disorder [episodic paroxysmal anxiety] without agoraphobia: Secondary | ICD-10-CM | POA: Diagnosis not present

## 2018-11-08 DIAGNOSIS — F411 Generalized anxiety disorder: Secondary | ICD-10-CM | POA: Diagnosis not present

## 2018-11-08 DIAGNOSIS — F331 Major depressive disorder, recurrent, moderate: Secondary | ICD-10-CM | POA: Diagnosis not present

## 2018-11-08 DIAGNOSIS — F410 Panic disorder [episodic paroxysmal anxiety] without agoraphobia: Secondary | ICD-10-CM | POA: Diagnosis not present

## 2018-11-20 ENCOUNTER — Telehealth: Payer: Self-pay | Admitting: Pharmacy Technician

## 2018-11-20 ENCOUNTER — Encounter: Payer: Medicaid Other | Admitting: Family

## 2018-11-20 DIAGNOSIS — F331 Major depressive disorder, recurrent, moderate: Secondary | ICD-10-CM | POA: Diagnosis not present

## 2018-11-20 DIAGNOSIS — F410 Panic disorder [episodic paroxysmal anxiety] without agoraphobia: Secondary | ICD-10-CM | POA: Diagnosis not present

## 2018-11-20 DIAGNOSIS — F411 Generalized anxiety disorder: Secondary | ICD-10-CM | POA: Diagnosis not present

## 2018-11-20 NOTE — Telephone Encounter (Signed)
RCID Patient Product/process development scientist completed.    The patient in insured through [facility] and their copay will be 00.  Netty Starring. Dimas Aguas CPhT Specialty Pharmacy Patient [facility] for Infectious Disease Phone: [phone] Fax:  [phone]

## 2018-11-21 DIAGNOSIS — F331 Major depressive disorder, recurrent, moderate: Secondary | ICD-10-CM | POA: Diagnosis not present

## 2018-11-21 DIAGNOSIS — F410 Panic disorder [episodic paroxysmal anxiety] without agoraphobia: Secondary | ICD-10-CM | POA: Diagnosis not present

## 2018-11-21 DIAGNOSIS — F411 Generalized anxiety disorder: Secondary | ICD-10-CM | POA: Diagnosis not present

## 2018-11-22 ENCOUNTER — Ambulatory Visit: Payer: Self-pay | Admitting: Physician Assistant

## 2018-11-22 NOTE — Progress Notes (Deleted)
Patient: Karen Huang, Female    DOB: May 25, 1996, 23 y.o.   MRN: [id number] Visit Date: 11/22/2018  Today's Provider: Trey Sailors, PA-C   No chief complaint on file.  Subjective:     Establish Care: Karen Huang is a 23 y.o. female who presents today to establish care. -----------------------------------------------------------------   Review of Systems  Social History      She  reports that she quit smoking about a year ago. She has never used smokeless tobacco. She reports that she does not drink alcohol or use drugs.       Social History   Socioeconomic History  . Marital status: Single    Spouse name: Not on file  . Number of children: Not on file  . Years of education: Not on file  . Highest education level: Not on file  Occupational History  . Not on file  Social Needs  . Financial resource strain: Not hard at all  . Food insecurity:    Worry: Never true    Inability: Never true  . Transportation needs:    Medical: No    Non-medical: No  Tobacco Use  . Smoking status: Former Smoker    Last attempt to quit: 11/17/2017    Years since quitting: 1.0  . Smokeless tobacco: Never Used  Substance and Sexual Activity  . Alcohol use: No  . Drug use: No  . Sexual activity: Yes    Birth control/protection: None  Lifestyle  . Physical activity:    Days per week: Patient refused    Minutes per session: Patient refused  . Stress: To some extent  Relationships  . Social connections:    Talks on phone: Patient refused    Gets together: Patient refused    Attends religious service: Patient refused    Active member of club or organization: Patient refused    Attends meetings of clubs or organizations: Patient refused    Relationship status: Patient refused  Other Topics Concern  . Not on file  Social History Narrative  . Not on file    Past Medical History:  Diagnosis Date  . Asthma   . Headache   . Hx of chlamydia infection    age  3  . SVD (spontaneous vaginal delivery) 06/21/2018     Patient Active Problem List   Diagnosis Date Noted  . PIH (pregnancy induced hypertension), third trimester 06/21/2018  . SVD (spontaneous vaginal delivery) 06/21/2018  . Postpartum care following vaginal delivery 06/21/2018    Past Surgical History:  Procedure Laterality Date  . TONSILLECTOMY      Family History        Family Status  Relation Name Status  . Father  Alive  . Sister  Alive  . PGM  Alive  . Mother  Alive  . Brother  Alive  . Mat [employer]  . Mat [employer]  . [employer]  . [employer]  Alive  . MGM  Alive  . MGF  Alive  . PGF  Deceased        Her family history includes ADD / ADHD in her brother; Alcohol abuse in her mother; COPD in her maternal grandmother and paternal grandmother; Diabetes in her paternal grandmother; Hypertension in her father, paternal grandmother, and sister.      Allergies  Allergen Reactions  . Amoxicillin Rash    Allergy is from childhood; was taken to the hospital by her mother  Has patient had a PCN reaction causing immediate rash, facial/tongue/throat swelling, SOB or lightheadedness with hypotension: Unknown Has patient had a PCN reaction causing severe rash involving mucus membranes or skin necrosis: No Has patient had a PCN reaction that required hospitalization: No Has patient had a PCN reaction occurring within the last 10 years: No If all of the above answers are "NO", then may proceed with Cephalos     Current Outpatient Medications:  .  acetaminophen (TYLENOL) 325 MG tablet, Take 2 tablets (650 mg total) by mouth every 4 (four) hours as needed (for pain scale < 4). (Patient not taking: Reported on 10/17/2018), Disp: 30 tablet, Rfl: 0 .  albuterol (PROVENTIL HFA;VENTOLIN HFA) 108 (90 Base) MCG/ACT inhaler, Inhale 2 puffs into the lungs every 6 (six) hours as needed for wheezing or shortness of breath., Disp: , Rfl:  .  ibuprofen (ADVIL,MOTRIN) 600 MG  tablet, Take 1 tablet (600 mg total) by mouth every 6 (six) hours. (Patient not taking: Reported on 10/17/2018), Disp: 30 tablet, Rfl: 0 .  labetalol (NORMODYNE) 100 MG tablet, Take 1 tablet (100 mg total) by mouth 2 (two) times daily. (Patient not taking: Reported on 10/17/2018), Disp: 30 tablet, Rfl: 1 .  metoCLOPramide (REGLAN) 10 MG tablet, Take 1 tablet (10 mg total) by mouth every 6 (six) hours as needed for nausea (nausea/headache)., Disp: 10 tablet, Rfl: 0   Patient Care Team: Patient, No Pcp Per as PCP - General (General Practice)    Objective:    Vitals: There were no vitals taken for this visit.  There were no vitals filed for this visit.   Physical Exam   Depression Screen No flowsheet data found.     Assessment & Plan:     Routine Health Maintenance and Physical Exam  Exercise Activities and Dietary recommendations Goals   None     There is no immunization history for the selected administration types on file for this patient.  Health Maintenance  Topic Date Due  . TETANUS/TDAP  04/01/2017  . CHLAMYDIA SCREENING  09/28/2017  . INFLUENZA VACCINE  04/05/2018  . HIV Screening  Completed     Discussed health benefits of physical activity, and encouraged her to engage in regular exercise appropriate for her age and condition.    --------------------------------------------------------------------    Trey Sailors, PA-C  [facility]

## 2018-11-27 ENCOUNTER — Telehealth: Payer: Self-pay | Admitting: Pharmacy Technician

## 2018-11-27 ENCOUNTER — Telehealth: Payer: Self-pay

## 2018-11-27 DIAGNOSIS — F331 Major depressive disorder, recurrent, moderate: Secondary | ICD-10-CM | POA: Diagnosis not present

## 2018-11-27 DIAGNOSIS — F410 Panic disorder [episodic paroxysmal anxiety] without agoraphobia: Secondary | ICD-10-CM | POA: Diagnosis not present

## 2018-11-27 DIAGNOSIS — F411 Generalized anxiety disorder: Secondary | ICD-10-CM | POA: Diagnosis not present

## 2018-11-27 NOTE — Telephone Encounter (Signed)
Patient advised as directed below.  Thanks,  -Ishani Goldwasser 

## 2018-11-27 NOTE — Telephone Encounter (Signed)
RCID Patient Product/process development scientist completed.    The patient in insured through [employer] [facility] with a zero copay.  Karen Huang. Karen Huang CPhT Specialty Pharmacy Patient [facility] for Infectious Disease Phone: [phone] Fax:  [phone]

## 2018-11-27 NOTE — Telephone Encounter (Signed)
May follow up with urgent care until then. She actually had scheduled new patient appointment on 11/22/2018 that she no showed.

## 2018-11-27 NOTE — Telephone Encounter (Signed)
Patient has not yet established here as a new patient. Her appointment is scheduled for 02/05/2019. Patient has been to the hospital for epigastric pain and they advised her to follow up with her PCP. Patient wants to know if she could be seen sooner? She is still has the same pain and wants to get checked out. Please advise.

## 2018-11-28 ENCOUNTER — Encounter: Payer: Self-pay | Admitting: Internal Medicine

## 2018-11-28 ENCOUNTER — Ambulatory Visit (INDEPENDENT_AMBULATORY_CARE_PROVIDER_SITE_OTHER): Payer: Medicaid Other | Admitting: Internal Medicine

## 2018-11-28 ENCOUNTER — Other Ambulatory Visit: Payer: Self-pay

## 2018-11-28 ENCOUNTER — Encounter: Payer: Self-pay | Admitting: Physician Assistant

## 2018-11-28 DIAGNOSIS — R768 Other specified abnormal immunological findings in serum: Secondary | ICD-10-CM | POA: Diagnosis not present

## 2018-11-28 NOTE — Progress Notes (Signed)
[facility]      Huang for Consult: hepatitis C antibody positive    Referring Physician: Dr. Mindi Slicker    Patient ID: Karen Huang, female    DOB: 11/13/1995, 22 y.o.   MRN: [id number]  HPI:   Here for evaluation of hepatitis C Ab.   Recently delivered and donated placenta.  Test was positive for hepatitis C.  She was tested and is hepatitis C antibody positive and RNA negative.  History of drug use with IV drugs at 23 years of age. No use since that time.  No tattoos.   Previous record reviewed of labs from her and placenta as summarized above.    Past Medical History:  Diagnosis Date  . Asthma   . Headache   . Hx of chlamydia infection    age 34  . SVD (spontaneous vaginal delivery) 06/21/2018    Prior to Admission medications   Medication Sig Start Date End Date Taking? Authorizing Provider  albuterol (PROVENTIL HFA;VENTOLIN HFA) 108 (90 Base) MCG/ACT inhaler Inhale 2 puffs into the lungs every 6 (six) hours as needed for wheezing or shortness of breath.   Yes [provider]  ALPRAZolam Prudy Feeler) 0.5 MG tablet Take 1 tablet by mouth 2 (two) times daily as needed. 10/23/18  Yes [provider]  FLUoxetine HCl 60 MG TABS Take 1 tablet by mouth daily. 11/20/18  Yes [provider]  pantoprazole (PROTONIX) 40 MG tablet Take 40 mg by mouth daily. 11/20/18  Yes [provider]    Allergies  Allergen Reactions  . Amoxicillin Rash    Allergy is from childhood; was taken to the hospital by her mother Has patient had a PCN reaction causing immediate rash, facial/tongue/throat swelling, SOB or lightheadedness with hypotension: Unknown Has patient had a PCN reaction causing severe rash involving mucus membranes or skin necrosis: No Has patient had a PCN reaction that required hospitalization: No Has patient had a PCN reaction occurring within the last 10 years: No If all of the above answers are "NO", then may proceed  with Cephalos    Social History   Tobacco Use  . Smoking status: Former Smoker    Last attempt to quit: 11/17/2017    Years since quitting: 1.0  . Smokeless tobacco: Never Used  Substance Use Topics  . Alcohol use: No  . Drug use: No    Family History  Problem Relation Age of Onset  . Hypertension Father   . Hypertension Sister   . Hypertension Paternal Grandmother   . COPD Paternal Grandmother   . Diabetes Paternal Grandmother   . Alcohol abuse Mother   . ADD / ADHD Brother   . COPD Maternal Grandmother     Review of Systems  Constitutional: negative for fatigue, malaise and anorexia Hematologic/lymphatic: negative for lymphadenopathy All other systems reviewed and are negative    Constitutional: in no apparent distress  Vitals:   11/28/18 0935  BP: 105/71  Pulse: 91  Temp: 98.4 F (36.9 C)   EYES: anicteric ENMT: no thrush Musculoskeletal: no joint swelling Skin: no rashes  Labs: Lab Results  Component Value Date   WBC 6.3 10/17/2018   HGB 13.6 10/17/2018   HCT 41.6 10/17/2018   MCV 88.3 10/17/2018   PLT 230 10/17/2018    Lab Results  Component Value Date   CREATININE 0.56 10/17/2018   BUN 15 10/17/2018   NA 137 10/17/2018   K 3.4 (L) 10/17/2018   CL 105  10/17/2018   CO2 22 10/17/2018    Lab Results  Component Value Date   ALT 17 10/17/2018   AST 18 10/17/2018   ALKPHOS 44 10/17/2018   BILITOT 0.5 10/17/2018     Assessment: history of exposure to hepatitis C with spontaneous clearance.  I discussed with her that 20% of patients exposed to hepatitis C spontaneously clear.  She will remain antibody positive so will not be able to donate blood.  Explained though that she does not have active disease and is not carry a risk of transmission.     Plan: 1) repeat RNA to reconfirm. Return as needed.

## 2018-12-04 DIAGNOSIS — F410 Panic disorder [episodic paroxysmal anxiety] without agoraphobia: Secondary | ICD-10-CM | POA: Diagnosis not present

## 2018-12-04 DIAGNOSIS — F411 Generalized anxiety disorder: Secondary | ICD-10-CM | POA: Diagnosis not present

## 2018-12-04 DIAGNOSIS — F331 Major depressive disorder, recurrent, moderate: Secondary | ICD-10-CM | POA: Diagnosis not present

## 2018-12-07 LAB — HEPATITIS C RNA QUANTITATIVE
HCV QUANT LOG: NOT DETECTED {Log_IU}/mL
HCV RNA, PCR, QN: NOT DETECTED [IU]/mL

## 2018-12-19 DIAGNOSIS — F411 Generalized anxiety disorder: Secondary | ICD-10-CM | POA: Diagnosis not present

## 2018-12-19 DIAGNOSIS — F331 Major depressive disorder, recurrent, moderate: Secondary | ICD-10-CM | POA: Diagnosis not present

## 2018-12-19 DIAGNOSIS — F410 Panic disorder [episodic paroxysmal anxiety] without agoraphobia: Secondary | ICD-10-CM | POA: Diagnosis not present

## 2019-01-02 DIAGNOSIS — F331 Major depressive disorder, recurrent, moderate: Secondary | ICD-10-CM | POA: Diagnosis not present

## 2019-01-02 DIAGNOSIS — F410 Panic disorder [episodic paroxysmal anxiety] without agoraphobia: Secondary | ICD-10-CM | POA: Diagnosis not present

## 2019-01-02 DIAGNOSIS — F411 Generalized anxiety disorder: Secondary | ICD-10-CM | POA: Diagnosis not present

## 2019-01-17 DIAGNOSIS — F410 Panic disorder [episodic paroxysmal anxiety] without agoraphobia: Secondary | ICD-10-CM | POA: Diagnosis not present

## 2019-01-17 DIAGNOSIS — F331 Major depressive disorder, recurrent, moderate: Secondary | ICD-10-CM | POA: Diagnosis not present

## 2019-01-17 DIAGNOSIS — F411 Generalized anxiety disorder: Secondary | ICD-10-CM | POA: Diagnosis not present

## 2019-01-24 DIAGNOSIS — F331 Major depressive disorder, recurrent, moderate: Secondary | ICD-10-CM | POA: Diagnosis not present

## 2019-01-24 DIAGNOSIS — F410 Panic disorder [episodic paroxysmal anxiety] without agoraphobia: Secondary | ICD-10-CM | POA: Diagnosis not present

## 2019-01-24 DIAGNOSIS — F411 Generalized anxiety disorder: Secondary | ICD-10-CM | POA: Diagnosis not present

## 2019-01-30 DIAGNOSIS — F411 Generalized anxiety disorder: Secondary | ICD-10-CM | POA: Diagnosis not present

## 2019-01-30 DIAGNOSIS — F410 Panic disorder [episodic paroxysmal anxiety] without agoraphobia: Secondary | ICD-10-CM | POA: Diagnosis not present

## 2019-01-30 DIAGNOSIS — F331 Major depressive disorder, recurrent, moderate: Secondary | ICD-10-CM | POA: Diagnosis not present

## 2019-02-05 ENCOUNTER — Encounter: Payer: Self-pay | Admitting: Physician Assistant

## 2019-02-05 ENCOUNTER — Other Ambulatory Visit: Payer: Self-pay

## 2019-02-05 ENCOUNTER — Ambulatory Visit (INDEPENDENT_AMBULATORY_CARE_PROVIDER_SITE_OTHER): Payer: Medicaid Other | Admitting: Physician Assistant

## 2019-02-05 VITALS — BP 108/80 | HR 80 | Temp 98.2°F | Ht 65.5 in | Wt 122.0 lb

## 2019-02-05 DIAGNOSIS — M545 Low back pain, unspecified: Secondary | ICD-10-CM

## 2019-02-05 DIAGNOSIS — R768 Other specified abnormal immunological findings in serum: Secondary | ICD-10-CM | POA: Diagnosis not present

## 2019-02-05 DIAGNOSIS — Z30013 Encounter for initial prescription of injectable contraceptive: Secondary | ICD-10-CM | POA: Diagnosis not present

## 2019-02-05 DIAGNOSIS — M542 Cervicalgia: Secondary | ICD-10-CM | POA: Diagnosis not present

## 2019-02-05 LAB — POCT URINE PREGNANCY: Preg Test, Ur: NEGATIVE

## 2019-02-05 MED ORDER — CYCLOBENZAPRINE HCL 5 MG PO TABS: 5.0000 mg | ORAL_TABLET | Freq: Every day | ORAL | 0 refills | Status: DC

## 2019-02-05 MED ORDER — GABAPENTIN 300 MG PO CAPS: 300.0000 mg | ORAL_CAPSULE | Freq: Every day | ORAL | 0 refills | Status: DC

## 2019-02-05 MED ORDER — MEDROXYPROGESTERONE ACETATE 150 MG/ML IM SUSP
150.0000 mg | Freq: Once | INTRAMUSCULAR | Status: AC
  Administered 2019-02-05: 150 mg via INTRAMUSCULAR

## 2019-02-05 NOTE — Progress Notes (Signed)
New Patient Office Visit  Subjective:  Patient ID: Karen Huang, female    DOB: 21-Aug-1996  Age: 23 y.o. MRN: [id number]  CC:  Chief Complaint  Patient presents with  . New Patient (Initial Visit)  . Back Pain  . Shoulder Pain    HPI  Living in [place], [place] with fiance and 84 month old - girl. Baby is healthy.   Karen Huang presents for Establish care.  Pt reports she has been having back pain in her shoulders and neck for 7 months now since the birth of her child.  The back pain is where she received the epidural and it shoots pain up in her back. Reports when she had epidural it only made half her body numb. Reports she has shooting pain in her low back pain when she lays down. Nothing makes it better - has tried stretching, exercise, tylenol, and ibuprofen.    Reports history of anxiety and depression. She is seen by mental health provider and counselr at the [facility] in [place]. She is taking 40 mg fluoxetine daily and also Xanax 0.5 mg PRN.  Veatrice Kells NP: Prescribed zalaplon but has not taken for sleep because she had bad reaction.  Xanax: once or twice per week. Has been seeing this center for four months.   She has a history of hepatitis C infection that resolved spontaneously. She had been followed by Dr. Luciana Axe at infectious [facility]. When she was aged 53 she reports she snorted and injected subutex. Last injected at age 21. She thinks she likely contracted hepatitis C through this.   She is currently not on any birth control. She would like to be. She had a mirena IUD but did not tolerate this. She reports she used the depo shot previously and she did well with this.  Past Medical History:  Diagnosis Date  . Allergies   . Anxiety   . Asthma   . Depression   . Headache   . Hx of chlamydia infection    age 37  . SVD (spontaneous vaginal delivery) 06/21/2018    Past Surgical History:  Procedure Laterality Date  . TONSILLECTOMY      Family  History  Problem Relation Age of Onset  . Hypertension Father   . Heart Problems Father   . Hypertension Sister   . Hypertension Paternal Grandmother   . COPD Paternal Grandmother   . Diabetes Paternal Grandmother   . Heart Problems Paternal Grandmother   . Alcohol abuse Mother   . ADD / ADHD Brother   . COPD Maternal Grandmother     Social History   Socioeconomic History  . Marital status: Single    Spouse name: Not on file  . Number of children: Not on file  . Years of education: Not on file  . Highest education level: Not on file  Occupational History  . Not on file  Social Needs  . Financial resource strain: Not hard at all  . Food insecurity:    Worry: Never true    Inability: Never true  . Transportation needs:    Medical: No    Non-medical: No  Tobacco Use  . Smoking status: Former Smoker    Types: E-cigarettes    Last attempt to quit: 11/17/2017    Years since quitting: 1.2  . Smokeless tobacco: Never Used  Substance and Sexual Activity  . Alcohol use: No  . Drug use: No  . Sexual activity: Yes    Birth control/protection:  None  Lifestyle  . Physical activity:    Days per week: Patient refused    Minutes per session: Patient refused  . Stress: To some extent  Relationships  . Social connections:    Talks on phone: Patient refused    Gets together: Patient refused    Attends religious service: Patient refused    Active member of club or organization: Patient refused    Attends meetings of clubs or organizations: Patient refused    Relationship status: Patient refused  . Intimate partner violence:    Fear of current or ex partner: No    Emotionally abused: No    Physically abused: No    Forced sexual activity: No  Other Topics Concern  . Not on file  Social History Narrative  . Not on file    ROS Review of Systems  Constitutional: Positive for diaphoresis and fatigue.  HENT: Negative.   Eyes: Negative.   Respiratory: Positive for chest  tightness and shortness of breath (asthma).   Cardiovascular: Positive for chest pain.  Gastrointestinal: Negative.   Endocrine: Negative.   Genitourinary: Negative.   Musculoskeletal: Positive for back pain (and pain in shoulders) and neck pain.  Skin: Negative.   Allergic/Immunologic: Negative.   Neurological: Positive for weakness, light-headedness, numbness and headaches.  Hematological: Negative.   Psychiatric/Behavioral: Positive for decreased concentration. The patient is nervous/anxious.     Objective:   Today's Vitals: BP 108/80 (BP Location: Left Arm, Patient Position: Sitting, Cuff Size: Normal)   Pulse 80   Temp 98.2 F (36.8 C) (Oral)   Ht 5' 5.5" (1.664 m)   Wt 122 lb (55.3 kg)   SpO2 98%   BMI 19.99 kg/m   Physical Exam  Assessment & Plan:  1. Neck pain  Will see her back in a couple of months for CPE with PAP.   - cyclobenzaprine (FLEXERIL) 5 MG tablet; Take 1 tablet (5 mg total) by mouth at bedtime.  Dispense: 30 tablet; Refill: 0 - gabapentin (NEURONTIN) 300 MG capsule; Take 1 capsule (300 mg total) by mouth at bedtime.  Dispense: 90 capsule; Refill: 0  2. Acute midline low back pain without sciatica  - cyclobenzaprine (FLEXERIL) 5 MG tablet; Take 1 tablet (5 mg total) by mouth at bedtime.  Dispense: 30 tablet; Refill: 0 - gabapentin (NEURONTIN) 300 MG capsule; Take 1 capsule (300 mg total) by mouth at bedtime.  Dispense: 90 capsule; Refill: 0  3. Hepatitis C antibody test positive   4. Encounter for initial prescription of injectable contraceptive  - medroxyPROGESTERone (DEPO-PROVERA) injection 150 mg - POCT urine pregnancy   Outpatient Encounter Medications as of 02/05/2019  Medication Sig  . albuterol (PROVENTIL HFA;VENTOLIN HFA) 108 (90 Base) MCG/ACT inhaler Inhale 2 puffs into the lungs every 6 (six) hours as needed for wheezing or shortness of breath.  . ALPRAZolam (XANAX) 0.5 MG tablet Take 1 tablet by mouth 2 (two) times daily as needed.  Marland Kitchen.  FLUoxetine HCl 60 MG TABS Take 1 tablet by mouth daily.  Marland Kitchen. TEMAZEPAM PO Take by mouth. Uses at night for sleep as needed per pt  . cyclobenzaprine (FLEXERIL) 5 MG tablet Take 1 tablet (5 mg total) by mouth at bedtime.  . gabapentin (NEURONTIN) 300 MG capsule Take 1 capsule (300 mg total) by mouth at bedtime.  . pantoprazole (PROTONIX) 40 MG tablet Take 40 mg by mouth daily.  . [EXPIRED] medroxyPROGESTERone (DEPO-PROVERA) injection 150 mg    No facility-administered encounter medications on file as of  02/05/2019.    The entirety of the information documented in the History of Present Illness, Review of Systems and Physical Exam were personally obtained by me. Portions of this information were initially documented by Loura Back, CMA and reviewed by me for thoroughness and accuracy.    Follow-up: Return in about 2 months (around 04/07/2019) for CPE.   Trey Sailors, PA-C

## 2019-02-05 NOTE — Patient Instructions (Signed)
Cervical Radiculopathy    Cervical radiculopathy means that a nerve in the neck is pinched or bruised. This can cause pain or loss of feeling (numbness) that runs from your neck to your arm and fingers.  Follow these instructions at home:  Managing pain  · Take over-the-counter and prescription medicines only as told by your doctor.  · If directed, put ice on the injured or painful area.  ? Put ice in a plastic bag.  ? Place a towel between your skin and the bag.  ? Leave the ice on for 20 minutes, 2-3 times per day.  · If ice does not help, you can try using heat. Take a warm shower or warm bath, or use a heat pack as told by your doctor.  · You may try a gentle neck and shoulder massage.  Activity  · Rest as needed. Follow instructions from your doctor about any activities to avoid.  · Do exercises as told by your doctor or physical therapist.  General instructions  · If you were given a soft collar, wear it as told by your doctor.  · Use a flat pillow when you sleep.  · Keep all follow-up visits as told by your doctor. This is important.  Contact a doctor if:  · Your condition does not improve with treatment.  Get help right away if:  · Your pain gets worse and is not controlled with medicine.  · You lose feeling or feel weak in your hand, arm, face, or leg.  · You have a fever.  · You have a stiff neck.  · You cannot control when you poop or pee (have incontinence).  · You have trouble with walking, balance, or talking.  This information is not intended to replace advice given to you by your health care provider. Make sure you discuss any questions you have with your health care provider.  Document Released: 08/11/2011 Document Revised: 01/28/2016 Document Reviewed: 10/16/2014  Elsevier Interactive Patient Education © 2019 [employer]

## 2019-04-08 ENCOUNTER — Other Ambulatory Visit: Payer: Self-pay

## 2019-04-08 ENCOUNTER — Encounter: Payer: Self-pay | Admitting: Family Medicine

## 2019-04-08 ENCOUNTER — Ambulatory Visit (INDEPENDENT_AMBULATORY_CARE_PROVIDER_SITE_OTHER): Payer: Medicaid Other | Admitting: Family Medicine

## 2019-04-08 VITALS — BP 120/70 | HR 71 | Temp 98.4°F | Wt 133.0 lb

## 2019-04-08 DIAGNOSIS — M79645 Pain in left finger(s): Secondary | ICD-10-CM | POA: Diagnosis not present

## 2019-04-08 DIAGNOSIS — S46812A Strain of other muscles, fascia and tendons at shoulder and upper arm level, left arm, initial encounter: Secondary | ICD-10-CM | POA: Diagnosis not present

## 2019-04-08 NOTE — Progress Notes (Signed)
Karen Huang  MRN: [id number] DOB: March 09, 1996  Subjective:  HPI   The patient is a 23 year old female who presents for evaluation of left index finger pain and bilateral shoulder pain.   Shoulder pain-she states this has been going on since the birth of her child 9 months ago.  She has recently taken a new job that involves a lot of moving and lifting of heavy items.  She states the left is worse than the right.  She was seen in June and was treated with cyclobenzaprine.  She states that at first it helped but then she did not see any relief from it.    Left finger pain-the patient states she had been having some cramping of the finger for about 10 days.  She states that this morning however it is just pain all the time.  Patient Active Problem List   Diagnosis Date Noted  . Hepatitis C antibody test positive 11/28/2018  . PIH (pregnancy induced hypertension), third trimester 06/21/2018  . SVD (spontaneous vaginal delivery) 06/21/2018  . Postpartum care following vaginal delivery 06/21/2018   Past Medical History:  Diagnosis Date  . Allergies   . Anxiety   . Asthma   . Depression   . Headache   . Hx of chlamydia infection    age 23  . SVD (spontaneous vaginal delivery) 06/21/2018   Past Surgical History:  Procedure Laterality Date  . TONSILLECTOMY     Social History   Socioeconomic History  . Marital status: Single    Spouse name: Not on file  . Number of children: Not on file  . Years of education: Not on file  . Highest education level: Not on file  Occupational History  . Not on file  Social Needs  . Financial resource strain: Not hard at all  . Food insecurity    Worry: Never true    Inability: Never true  . Transportation needs    Medical: No    Non-medical: No  Tobacco Use  . Smoking status: Former Smoker    Types: E-cigarettes    Quit date: 11/17/2017    Years since quitting: 1.3  . Smokeless tobacco: Never Used  Substance and Sexual Activity   . Alcohol use: No  . Drug use: No  . Sexual activity: Yes    Birth control/protection: None  Lifestyle  . Physical activity    Days per week: Patient refused    Minutes per session: Patient refused  . Stress: To some extent  Relationships  . Social Musicianconnections    Talks on phone: Patient refused    Gets together: Patient refused    Attends religious service: Patient refused    Active member of club or organization: Patient refused    Attends meetings of clubs or organizations: Patient refused    Relationship status: Patient refused  . Intimate partner violence    Fear of current or ex partner: No    Emotionally abused: No    Physically abused: No    Forced sexual activity: No  Other Topics Concern  . Not on file  Social History Narrative  . Not on file   Outpatient Encounter Medications as of 04/08/2019  Medication Sig  . albuterol (PROVENTIL HFA;VENTOLIN HFA) 108 (90 Base) MCG/ACT inhaler Inhale 2 puffs into the lungs every 6 (six) hours as needed for wheezing or shortness of breath.  . ALPRAZolam (XANAX) 0.5 MG tablet Take 1 tablet by mouth 2 (two) times daily as needed.  .Marland Kitchen  FLUoxetine HCl 60 MG TABS Take 1 tablet by mouth daily.  . cyclobenzaprine (FLEXERIL) 5 MG tablet Take 1 tablet (5 mg total) by mouth at bedtime. (Patient not taking: Reported on 04/08/2019)  . gabapentin (NEURONTIN) 300 MG capsule Take 1 capsule (300 mg total) by mouth at bedtime. (Patient not taking: Reported on 04/08/2019)  . pantoprazole (PROTONIX) 40 MG tablet Take 40 mg by mouth daily.  Marland Kitchen TEMAZEPAM PO Take by mouth. Uses at night for sleep as needed per pt   No facility-administered encounter medications on file as of 04/08/2019.    Allergies  Allergen Reactions  . Amoxicillin Rash    Allergy is from childhood; was taken to the hospital by her mother Has patient had a PCN reaction causing immediate rash, facial/tongue/throat swelling, SOB or lightheadedness with hypotension: Unknown Has patient had a  PCN reaction causing severe rash involving mucus membranes or skin necrosis: No Has patient had a PCN reaction that required hospitalization: No Has patient had a PCN reaction occurring within the last 10 years: No If all of the above answers are "NO", then may proceed with Cephalos   Review of Systems  Constitutional: Negative for fever and malaise/fatigue.  HENT: Negative for congestion, ear pain, sinus pain and sore throat.   Respiratory: Negative for cough, shortness of breath and wheezing.   Cardiovascular: Negative for chest pain.  Gastrointestinal: Negative for abdominal pain and diarrhea.  Musculoskeletal: Positive for joint pain, myalgias and neck pain.    Objective:  BP 120/70 (BP Location: Right Arm, Patient Position: Sitting, Cuff Size: Normal)   Pulse 71   Temp 98.4 F (36.9 C) (Oral)   Wt 133 lb (60.3 kg)   SpO2 97%   BMI 21.80 kg/m   Physical Exam  Constitutional: She is oriented to person, place, and time and well-developed, well-nourished, and in no distress.  HENT:  Head: Normocephalic.  Eyes: Conjunctivae are normal.  Neck: Neck supple.  Cardiovascular: Normal rate and regular rhythm.  Pulmonary/Chest: Effort normal and breath sounds normal.  Abdominal: Soft. Bowel sounds are normal.  Musculoskeletal: Normal range of motion.        General: Tenderness present.     Comments: Tender to palpate and test ROM of the left index finger MCP and DIP joints. Fair grip strength bilaterally. Normal pulses and no numbness. Good DTR's upper extremities.  Neurological: She is alert and oriented to person, place, and time.  Skin: No rash noted.  Psychiatric: Mood, affect and judgment normal.    Assessment and Plan :  1. Finger pain, left Pain and tenderness in the left index finger the past month since starting a new job that requires repetitive lifting of heavy items with the left hand and arm. No specific injury known. No swelling or deformity of joints. Very tender to  palpate or check ROM of the MCP and DIP joints. No family history of arthritis known. May use Voltaren Gel prn and get labs to rule out electrolyte imbalance causing cramps in hand or signs of rheumatoid disease. - CBC with Differential/Platelet - Sedimentation rate - Rheumatoid Factor - Basic Metabolic Panel (BMET)  2. Strain of left trapezius muscle, initial encounter Onset over the past 2 months and prominent discomfort to reach overhead or palpate the left trapezius. Suspect strain secondary to repetitive motion activities at a new job the past 1-2 months. May use Aspercreme with Lidocaine. Check labs for inflammation or electrolyte imbalance. May need physical therapy with Iontophoresis with Dexamethasone. No relief from  Ibuprofen or Flexeril. - CBC with Differential/Platelet - Sedimentation rate - Basic Metabolic Panel (BMET)

## 2019-04-09 ENCOUNTER — Telehealth: Payer: Self-pay

## 2019-04-09 ENCOUNTER — Other Ambulatory Visit: Payer: Self-pay | Admitting: Family Medicine

## 2019-04-09 DIAGNOSIS — M542 Cervicalgia: Secondary | ICD-10-CM

## 2019-04-09 DIAGNOSIS — M545 Low back pain, unspecified: Secondary | ICD-10-CM

## 2019-04-09 LAB — CBC WITH DIFFERENTIAL/PLATELET
Basophils Absolute: 0 10*3/uL (ref 0.0–0.2)
Basos: 1 %
EOS (ABSOLUTE): 0.1 10*3/uL (ref 0.0–0.4)
Eos: 2 %
Hematocrit: 41.4 % (ref 34.0–46.6)
Hemoglobin: 13.3 g/dL (ref 11.1–15.9)
Immature Grans (Abs): 0 10*3/uL (ref 0.0–0.1)
Immature Granulocytes: 0 %
Lymphocytes Absolute: 2.3 10*3/uL (ref 0.7–3.1)
Lymphs: 44 %
MCH: 29 pg (ref 26.6–33.0)
MCHC: 32.1 g/dL (ref 31.5–35.7)
MCV: 90 fL (ref 79–97)
Monocytes Absolute: 0.4 10*3/uL (ref 0.1–0.9)
Monocytes: 8 %
Neutrophils Absolute: 2.4 10*3/uL (ref 1.4–7.0)
Neutrophils: 45 %
Platelets: 272 10*3/uL (ref 150–450)
RBC: 4.58 x10E6/uL (ref 3.77–5.28)
RDW: 12.8 % (ref 11.7–15.4)
WBC: 5.3 10*3/uL (ref 3.4–10.8)

## 2019-04-09 LAB — BASIC METABOLIC PANEL
BUN/Creatinine Ratio: 19 (ref 9–23)
BUN: 14 mg/dL (ref 6–20)
CO2: 21 mmol/L (ref 20–29)
Calcium: 10 mg/dL (ref 8.7–10.2)
Chloride: 103 mmol/L (ref 96–106)
Creatinine, Ser: 0.72 mg/dL (ref 0.57–1.00)
GFR calc Af Amer: 138 mL/min/{1.73_m2} (ref >59)
GFR calc non Af Amer: 120 mL/min/{1.73_m2} (ref >59)
Glucose: 85 mg/dL (ref 65–99)
Potassium: 4.5 mmol/L (ref 3.5–5.2)
Sodium: 139 mmol/L (ref 134–144)

## 2019-04-09 LAB — RHEUMATOID FACTOR: Rheumatoid fact SerPl-aCnc: <10 IU/mL (ref 0.0–13.9)

## 2019-04-09 LAB — SEDIMENTATION RATE: Sed Rate: 2 mm/hr (ref 0–32)

## 2019-04-09 MED ORDER — CYCLOBENZAPRINE HCL 5 MG PO TABS: 5.0000 mg | ORAL_TABLET | Freq: Every day | ORAL | 0 refills | Status: DC

## 2019-04-09 NOTE — Telephone Encounter (Signed)
-----   Message from Margo Common, [place] sent at 04/09/2019  9:16 AM EDT ----- All blood tests are normal. No sign of infection or electrolyte imbalances. Arthritis tests are negative for rheumatoid disease. Proceed with the Voltaren Gel for finger and Aspercreme with Lidocaine for shoulder as needed. If not improving in 4-5 days, will schedule for physical therapy appointment.

## 2019-04-09 NOTE — Telephone Encounter (Signed)
Patient notified of lab results. She is requesting a prescription for Flexeril.

## 2019-04-09 NOTE — Telephone Encounter (Signed)
Sent to [facility]. Recheck as needed.

## 2019-04-10 DIAGNOSIS — F411 Generalized anxiety disorder: Secondary | ICD-10-CM | POA: Diagnosis not present

## 2019-04-10 DIAGNOSIS — F331 Major depressive disorder, recurrent, moderate: Secondary | ICD-10-CM | POA: Diagnosis not present

## 2019-04-10 DIAGNOSIS — F410 Panic disorder [episodic paroxysmal anxiety] without agoraphobia: Secondary | ICD-10-CM | POA: Diagnosis not present

## 2019-04-10 NOTE — Telephone Encounter (Signed)
Left message that medication was sent to the pharmacy

## 2019-05-02 ENCOUNTER — Telehealth: Payer: Self-pay | Admitting: *Deleted

## 2019-05-02 DIAGNOSIS — Z20822 Contact with and (suspected) exposure to covid-19: Secondary | ICD-10-CM

## 2019-05-02 NOTE — Telephone Encounter (Signed)
Called pt for prescreening before her CPE appt 05/03/2019. Patient stated she has been exposed to positive covid 19 patient's about 1 week ago. Patient has symptoms of diarrhea and fatigue x 3 days. Please advise? Patient wants to know if she should get tested for covid?

## 2019-05-02 NOTE — Progress Notes (Deleted)
Patient: Karen Huang, Female    DOB: September 29, 1995, 23 y.o.   MRN: [id number] Visit Date: 05/02/2019  Today's Provider: Trinna Post, PA-C   No chief complaint on file.  Subjective:     Annual physical exam Karen Huang is a 23 y.o. female who presents today for health maintenance and complete physical. She feels {DESC; WELL/FAIRLY WELL/POORLY:18703}. She reports exercising ***. She reports she is sleeping {DESC; WELL/FAIRLY WELL/POORLY:18703}.  -----------------------------------------------------------------   Review of Systems  Social History      She  reports that she quit smoking about 17 months ago. Her smoking use included e-cigarettes. She has never used smokeless tobacco. She reports that she does not drink alcohol or use drugs.       Social History   Socioeconomic History  . Marital status: Single    Spouse name: Not on file  . Number of children: Not on file  . Years of education: Not on file  . Highest education level: Not on file  Occupational History  . Not on file  Social Needs  . Financial resource strain: Not hard at all  . Food insecurity    Worry: Never true    Inability: Never true  . Transportation needs    Medical: No    Non-medical: No  Tobacco Use  . Smoking status: Former Smoker    Types: E-cigarettes    Quit date: 11/17/2017    Years since quitting: 1.4  . Smokeless tobacco: Never Used  Substance and Sexual Activity  . Alcohol use: No  . Drug use: No  . Sexual activity: Yes    Birth control/protection: None  Lifestyle  . Physical activity    Days per week: Patient refused    Minutes per session: Patient refused  . Stress: To some extent  Relationships  . Social Herbalist on phone: Patient refused    Gets together: Patient refused    Attends religious service: Patient refused    Active member of club or organization: Patient refused    Attends meetings of clubs or organizations: Patient refused   Relationship status: Patient refused  Other Topics Concern  . Not on file  Social History Narrative  . Not on file    Past Medical History:  Diagnosis Date  . Allergies   . Anxiety   . Asthma   . Depression   . Headache   . Hx of chlamydia infection    age 28  . SVD (spontaneous vaginal delivery) 06/21/2018     Patient Active Problem List   Diagnosis Date Noted  . Hepatitis C antibody test positive 11/28/2018  . PIH (pregnancy induced hypertension), third trimester 06/21/2018  . SVD (spontaneous vaginal delivery) 06/21/2018  . Postpartum care following vaginal delivery 06/21/2018    Past Surgical History:  Procedure Laterality Date  . TONSILLECTOMY      Family History        Family Status  Relation Name Status  . Father  Alive  . Sister  Alive  . PGM  Alive  . Mother  Alive  . Brother  Alive  . [employer]  . Mat [employer]  . [employer]  . [employer]  Alive  . MGM  Alive  . MGF  Alive  . PGF  Deceased        Her family history includes ADD / ADHD in her brother; Alcohol abuse in her mother; COPD  in her maternal grandmother and paternal grandmother; Diabetes in her paternal grandmother; Heart Problems in her father and paternal grandmother; Hypertension in her father, paternal grandmother, and sister.      Allergies  Allergen Reactions  . Amoxicillin Rash    Allergy is from childhood; was taken to the hospital by her mother Has patient had a PCN reaction causing immediate rash, facial/tongue/throat swelling, SOB or lightheadedness with hypotension: Unknown Has patient had a PCN reaction causing severe rash involving mucus membranes or skin necrosis: No Has patient had a PCN reaction that required hospitalization: No Has patient had a PCN reaction occurring within the last 10 years: No If all of the above answers are "NO", then may proceed with Cephalos     Current Outpatient Medications:  .  albuterol (PROVENTIL HFA;VENTOLIN HFA) 108 (90  Base) MCG/ACT inhaler, Inhale 2 puffs into the lungs every 6 (six) hours as needed for wheezing or shortness of breath., Disp: , Rfl:  .  ALPRAZolam (XANAX) 0.5 MG tablet, Take 1 tablet by mouth 2 (two) times daily as needed., Disp: , Rfl:  .  cyclobenzaprine (FLEXERIL) 5 MG tablet, Take 1 tablet (5 mg total) by mouth at bedtime., Disp: 15 tablet, Rfl: 0 .  FLUoxetine HCl 60 MG TABS, Take 1 tablet by mouth daily., Disp: , Rfl:  .  gabapentin (NEURONTIN) 300 MG capsule, Take 1 capsule (300 mg total) by mouth at bedtime. (Patient not taking: Reported on 04/08/2019), Disp: 90 capsule, Rfl: 0 .  pantoprazole (PROTONIX) 40 MG tablet, Take 40 mg by mouth daily., Disp: , Rfl:  .  TEMAZEPAM PO, Take by mouth. Uses at night for sleep as needed per pt, Disp: , Rfl:    Patient Care Team: Maryella ShiversPollak, Adriana M, PA-C as PCP - General (Physician Assistant)    Objective:    Vitals: There were no vitals taken for this visit.  There were no vitals filed for this visit.   Physical Exam   Depression Screen PHQ 2/9 Scores 02/05/2019 02/05/2019 11/28/2018  PHQ - 2 Score 2 1 -  PHQ- 9 Score 10 - -  Exception Documentation - - Other- indicate reason in comment box  Not completed - - Currently in treatment with another provider, feels medications are working well       Assessment & Plan:     Routine Health Maintenance and Physical Exam  Exercise Activities and Dietary recommendations Goals   None     There is no immunization history for the selected administration types on file for this patient.  Health Maintenance  Topic Date Due  . TETANUS/TDAP  04/01/2017  . CHLAMYDIA SCREENING  09/28/2017  . PAP-Cervical Cytology Screening  04/02/2019  . PAP SMEAR-Modifier  04/02/2019  . INFLUENZA VACCINE  04/06/2019  . HIV Screening  Completed     Discussed health benefits of physical activity, and encouraged her to engage in regular exercise appropriate for her age and condition.     --------------------------------------------------------------------    Trey SailorsAdriana M Pollak, PA-C  [facility]

## 2019-05-03 ENCOUNTER — Encounter: Payer: Medicaid Other | Admitting: Physician Assistant

## 2019-05-03 ENCOUNTER — Other Ambulatory Visit: Payer: Self-pay

## 2019-05-03 DIAGNOSIS — R6889 Other general symptoms and signs: Secondary | ICD-10-CM | POA: Diagnosis not present

## 2019-05-03 DIAGNOSIS — Z20822 Contact with and (suspected) exposure to covid-19: Secondary | ICD-10-CM

## 2019-05-03 NOTE — Telephone Encounter (Signed)
Yes, place order and can reschedule her CPE. Thank you.

## 2019-05-03 NOTE — Addendum Note (Signed)
Addended by: Casimer Leek C on: 05/03/2019 09:20 AM   Modules accepted: Orders

## 2019-05-03 NOTE — Telephone Encounter (Signed)
Patient was advised and appointment rescheduled.

## 2019-05-05 LAB — NOVEL CORONAVIRUS, NAA: SARS-CoV-2, NAA: NOT DETECTED

## 2019-05-06 ENCOUNTER — Telehealth: Payer: Self-pay

## 2019-05-06 DIAGNOSIS — F411 Generalized anxiety disorder: Secondary | ICD-10-CM | POA: Diagnosis not present

## 2019-05-06 DIAGNOSIS — F410 Panic disorder [episodic paroxysmal anxiety] without agoraphobia: Secondary | ICD-10-CM | POA: Diagnosis not present

## 2019-05-06 DIAGNOSIS — F331 Major depressive disorder, recurrent, moderate: Secondary | ICD-10-CM | POA: Diagnosis not present

## 2019-05-06 NOTE — Telephone Encounter (Signed)
Patient was advised.  

## 2019-05-06 NOTE — Telephone Encounter (Signed)
-----   Message from Trinna Post, [place] sent at 05/06/2019  4:39 PM EDT ----- COVID test negative.

## 2019-05-14 ENCOUNTER — Encounter: Payer: Medicaid Other | Admitting: Physician Assistant

## 2019-05-17 ENCOUNTER — Other Ambulatory Visit: Payer: Self-pay

## 2019-05-17 ENCOUNTER — Encounter: Payer: Self-pay | Admitting: Physician Assistant

## 2019-05-17 ENCOUNTER — Other Ambulatory Visit (HOSPITAL_COMMUNITY)
Admission: RE | Admit: 2019-05-17 | Discharge: 2019-05-17 | Disposition: A | Discharge status: Home / Self Care | Payer: Medicaid Other | Source: Ambulatory Visit | Attending: Physician Assistant | Admitting: Physician Assistant

## 2019-05-17 ENCOUNTER — Ambulatory Visit (INDEPENDENT_AMBULATORY_CARE_PROVIDER_SITE_OTHER): Payer: Medicaid Other | Admitting: Physician Assistant

## 2019-05-17 VITALS — BP 104/70 | HR 87 | Temp 97.3°F | Resp 16 | Ht 66.0 in | Wt 132.6 lb

## 2019-05-17 DIAGNOSIS — Z124 Encounter for screening for malignant neoplasm of cervix: Secondary | ICD-10-CM

## 2019-05-17 DIAGNOSIS — N912 Amenorrhea, unspecified: Secondary | ICD-10-CM

## 2019-05-17 DIAGNOSIS — Z3202 Encounter for pregnancy test, result negative: Secondary | ICD-10-CM

## 2019-05-17 DIAGNOSIS — Z0000 Encounter for general adult medical examination without abnormal findings: Secondary | ICD-10-CM | POA: Diagnosis not present

## 2019-05-17 DIAGNOSIS — Z3042 Encounter for surveillance of injectable contraceptive: Secondary | ICD-10-CM

## 2019-05-17 LAB — POCT URINE PREGNANCY: Preg Test, Ur: NEGATIVE

## 2019-05-17 MED ORDER — MEDROXYPROGESTERONE ACETATE 150 MG/ML IM SUSP: 150.0000 mg | Freq: Once | INTRAMUSCULAR | Status: DC

## 2019-05-17 MED ORDER — MEDROXYPROGESTERONE ACETATE 150 MG/ML IM SUSP
150.0000 mg | Freq: Once | INTRAMUSCULAR | Status: AC
  Administered 2019-05-17: 150 mg via INTRAMUSCULAR

## 2019-05-17 NOTE — Patient Instructions (Signed)
Health Maintenance, Female Adopting a healthy lifestyle and getting preventive care are important in promoting health and wellness. Ask your health care provider about:  The right schedule for you to have regular tests and exams.  Things you can do on your own to prevent diseases and keep yourself healthy. What should I know about diet, weight, and exercise? Eat a healthy diet   Eat a diet that includes plenty of vegetables, fruits, low-fat dairy products, and lean protein.  Do not eat a lot of foods that are high in solid fats, added sugars, or sodium. Maintain a healthy weight Body mass index (BMI) is used to identify weight problems. It estimates body fat based on height and weight. Your health care provider can help determine your BMI and help you achieve or maintain a healthy weight. Get regular exercise Get regular exercise. This is one of the most important things you can do for your health. Most adults should:  Exercise for at least 150 minutes each week. The exercise should increase your heart rate and make you sweat (moderate-intensity exercise).  Do strengthening exercises at least twice a week. This is in addition to the moderate-intensity exercise.  Spend less time sitting. Even light physical activity can be beneficial. Watch cholesterol and blood lipids Have your blood tested for lipids and cholesterol at 20 years of age, then have this test every 5 years. Have your cholesterol levels checked more often if:  Your lipid or cholesterol levels are high.  You are older than 21 years of age.  You are at high risk for heart disease. What should I know about cancer screening? Depending on your health history and family history, you may need to have cancer screening at various ages. This may include screening for:  Breast cancer.  Cervical cancer.  Colorectal cancer.  Skin cancer.  Lung cancer. What should I know about heart disease, diabetes, and high blood  pressure? Blood pressure and heart disease  High blood pressure causes heart disease and increases the risk of stroke. This is more likely to develop in people who have high blood pressure readings, are of African descent, or are overweight.  Have your blood pressure checked: ? Every 3-5 years if you are 18-39 years of age. ? Every year if you are 40 years old or older. Diabetes Have regular diabetes screenings. This checks your fasting blood sugar level. Have the screening done:  Once every three years after age 40 if you are at a normal weight and have a low risk for diabetes.  More often and at a younger age if you are overweight or have a high risk for diabetes. What should I know about preventing infection? Hepatitis B If you have a higher risk for hepatitis B, you should be screened for this virus. Talk with your health care provider to find out if you are at risk for hepatitis B infection. Hepatitis C Testing is recommended for:  Everyone born from 1945 through 1965.  Anyone with known risk factors for hepatitis C. Sexually transmitted infections (STIs)  Get screened for STIs, including gonorrhea and chlamydia, if: ? You are sexually active and are younger than 21 years of age. ? You are older than 21 years of age and your health care provider tells you that you are at risk for this type of infection. ? Your sexual activity has changed since you were last screened, and you are at increased risk for chlamydia or gonorrhea. Ask your health care provider if   you are at risk.  Ask your health care provider about whether you are at high risk for HIV. Your health care provider may recommend a prescription medicine to help prevent HIV infection. If you choose to take medicine to prevent HIV, you should first get tested for HIV. You should then be tested every 3 months for as long as you are taking the medicine. Pregnancy  If you are about to stop having your period (premenopausal) and  you may become pregnant, seek counseling before you get pregnant.  Take 400 to 800 micrograms (mcg) of folic acid every day if you become pregnant.  Ask for birth control (contraception) if you want to prevent pregnancy. Osteoporosis and menopause Osteoporosis is a disease in which the bones lose minerals and strength with aging. This can result in bone fractures. If you are 65 years old or older, or if you are at risk for osteoporosis and fractures, ask your health care provider if you should:  Be screened for bone loss.  Take a calcium or vitamin D supplement to lower your risk of fractures.  Be given hormone replacement therapy (HRT) to treat symptoms of menopause. Follow these instructions at home: Lifestyle  Do not use any products that contain nicotine or tobacco, such as cigarettes, e-cigarettes, and chewing tobacco. If you need help quitting, ask your health care provider.  Do not use street drugs.  Do not share needles.  Ask your health care provider for help if you need support or information about quitting drugs. Alcohol use  Do not drink alcohol if: ? Your health care provider tells you not to drink. ? You are pregnant, may be pregnant, or are planning to become pregnant.  If you drink alcohol: ? Limit how much you use to 0-1 drink a day. ? Limit intake if you are breastfeeding.  Be aware of how much alcohol is in your drink. In the [place] one drink equals one 12 oz bottle of beer (355 mL), one 5 oz glass of wine (148 mL), or one 1 oz glass of hard liquor (44 mL). General instructions  Schedule regular health, dental, and eye exams.  Stay current with your vaccines.  Tell your health care provider if: ? You often feel depressed. ? You have ever been abused or do not feel safe at home. Summary  Adopting a healthy lifestyle and getting preventive care are important in promoting health and wellness.  Follow your health care provider's instructions about healthy  diet, exercising, and getting tested or screened for diseases.  Follow your health care provider's instructions on monitoring your cholesterol and blood pressure. This information is not intended to replace advice given to you by your health care provider. Make sure you discuss any questions you have with your health care provider. Document Released: 03/07/2011 Document Revised: 08/15/2018 Document Reviewed: 08/15/2018 Elsevier Patient Education  2020 Elsevier Inc.  

## 2019-05-17 NOTE — Progress Notes (Signed)
Patient: Karen Huang, Female    DOB: 08/08/1996, 23 y.o.   MRN: [id number] Visit Date: 05/17/2019  Today's Provider: Trey SailorsAdriana M Roberts Bon, PA-C   Chief Complaint  Patient presents with  . Annual Exam   Subjective:     Annual physical exam Karen Huang is a 23 y.o. female who presents today for health maintenance and complete physical. She feels well. She reports exercising no. She reports she is sleeping well.  She is due for a PAP smear. She is late today for a depo shot.  -----------------------------------------------------------------   Review of Systems  Constitutional: Negative.   HENT: Negative.   Eyes: Negative.   Respiratory: Negative.   Cardiovascular: Negative.   Gastrointestinal: Negative.   Endocrine: Negative.   Genitourinary: Negative.   Musculoskeletal: Positive for neck pain and neck stiffness.  Skin: Negative.   Allergic/Immunologic: Negative.   Neurological: Positive for weakness.  Hematological: Negative.   Psychiatric/Behavioral: The patient is nervous/anxious.     Social History      She  reports that she quit smoking about 17 months ago. Her smoking use included e-cigarettes. She has never used smokeless tobacco. She reports that she does not drink alcohol or use drugs.       Social History   Socioeconomic History  . Marital status: Single    Spouse name: Not on file  . Number of children: Not on file  . Years of education: Not on file  . Highest education level: Not on file  Occupational History  . Not on file  Social Needs  . Financial resource strain: Not hard at all  . Food insecurity    Worry: Never true    Inability: Never true  . Transportation needs    Medical: No    Non-medical: No  Tobacco Use  . Smoking status: Former Smoker    Types: E-cigarettes    Quit date: 11/17/2017    Years since quitting: 1.4  . Smokeless tobacco: Never Used  Substance and Sexual Activity  . Alcohol use: No  . Drug use: No  .  Sexual activity: Yes    Birth control/protection: None  Lifestyle  . Physical activity    Days per week: Patient refused    Minutes per session: Patient refused  . Stress: To some extent  Relationships  . Social Musicianconnections    Talks on phone: Patient refused    Gets together: Patient refused    Attends religious service: Patient refused    Active member of club or organization: Patient refused    Attends meetings of clubs or organizations: Patient refused    Relationship status: Patient refused  Other Topics Concern  . Not on file  Social History Narrative  . Not on file    Past Medical History:  Diagnosis Date  . Allergies   . Anxiety   . Asthma   . Depression   . Headache   . Hx of chlamydia infection    age 23  . SVD (spontaneous vaginal delivery) 06/21/2018     Patient Active Problem List   Diagnosis Date Noted  . Hepatitis C antibody test positive 11/28/2018  . PIH (pregnancy induced hypertension), third trimester 06/21/2018  . SVD (spontaneous vaginal delivery) 06/21/2018  . Postpartum care following vaginal delivery 06/21/2018    Past Surgical History:  Procedure Laterality Date  . TONSILLECTOMY      Family History        Family Status  Relation Name Status  .  Father  Alive  . Sister  Alive  . PGM  Alive  . Mother  Alive  . Brother  Alive  . [employer]  . [employer]  . [employer]  . [employer]  Alive  . MGM  Alive  . MGF  Alive  . PGF  Deceased        Her family history includes ADD / ADHD in her brother; Alcohol abuse in her mother; COPD in her maternal grandmother and paternal grandmother; Diabetes in her paternal grandmother; Heart Problems in her father and paternal grandmother; Hypertension in her father, paternal grandmother, and sister.      Allergies  Allergen Reactions  . Amoxicillin Rash    Allergy is from childhood; was taken to the hospital by her mother Has patient had a PCN reaction causing immediate rash,  facial/tongue/throat swelling, SOB or lightheadedness with hypotension: Unknown Has patient had a PCN reaction causing severe rash involving mucus membranes or skin necrosis: No Has patient had a PCN reaction that required hospitalization: No Has patient had a PCN reaction occurring within the last 10 years: No If all of the above answers are "NO", then may proceed with Cephalos     Current Outpatient Medications:  .  ALPRAZolam (XANAX) 0.5 MG tablet, Take 1 tablet by mouth 2 (two) times daily as needed., Disp: , Rfl:  .  FLUoxetine HCl 60 MG TABS, Take 1 tablet by mouth daily., Disp: , Rfl:  .  albuterol (PROVENTIL HFA;VENTOLIN HFA) 108 (90 Base) MCG/ACT inhaler, Inhale 2 puffs into the lungs every 6 (six) hours as needed for wheezing or shortness of breath., Disp: , Rfl:    Patient Care Team: Paulene Floor as PCP - General (Physician Assistant)    Objective:    Vitals: BP 104/70 (BP Location: Left Arm, Patient Position: Sitting, Cuff Size: Normal)   Pulse 87   Temp (!) 97.3 F (36.3 C) (Temporal)   Resp 16   Ht 5\' 6"  (1.676 m)   Wt 132 lb 9.6 oz (60.1 kg)   SpO2 97%   BMI 21.40 kg/m    Vitals:   05/17/19 1359  BP: 104/70  Pulse: 87  Resp: 16  Temp: (!) 97.3 F (36.3 C)  TempSrc: Temporal  SpO2: 97%  Weight: 132 lb 9.6 oz (60.1 kg)  Height: 5\' 6"  (1.676 m)     Physical Exam Exam conducted with a chaperone present.  Constitutional:      Appearance: Normal appearance.  Cardiovascular:     Rate and Rhythm: Normal rate and regular rhythm.     Heart sounds: Normal heart sounds.  Pulmonary:     Breath sounds: Normal breath sounds.  Genitourinary:    Vagina: Normal.     Cervix: Normal.     Uterus: Normal.      Adnexa: Right adnexa normal and left adnexa normal.  Skin:    General: Skin is warm and dry.  Neurological:     Mental Status: She is alert and oriented to person, place, and time. Mental status is at baseline.  Psychiatric:        Mood and  Affect: Mood normal.        Behavior: Behavior normal.      Depression Screen PHQ 2/9 Scores 02/05/2019 02/05/2019 11/28/2018  PHQ - 2 Score 2 1 -  PHQ- 9 Score 10 - -  Exception Documentation - - Other- indicate reason in comment box  Not  completed - - Currently in treatment with another provider, feels medications are working well       Assessment & Plan:     Routine Health Maintenance and Physical Exam  Exercise Activities and Dietary recommendations Goals   None     There is no immunization history for the selected administration types on file for this patient.  Health Maintenance  Topic Date Due  . TETANUS/TDAP  04/01/2017  . CHLAMYDIA SCREENING  09/28/2017  . PAP-Cervical Cytology Screening  04/02/2019  . INFLUENZA VACCINE  04/06/2019  . PAP SMEAR-Modifier  04/02/2019  . HIV Screening  Completed     Discussed health benefits of physical activity, and encouraged her to engage in regular exercise appropriate for her age and condition.    1. Annual physical exam   2. Cervical cancer screening  - Cytology - PAP  3. Amenorrhea  - POCT urine pregnancy  4. Encounter for Depo-Provera contraception  Late for depo shot today. Pregnancy negative. Injection update.   The entirety of the information documented in the History of Present Illness, Review of Systems and Physical Exam were personally obtained by me. Portions of this information were initially documented by Rondel Baton, CMA and reviewed by me for thoroughness and accuracy.    F/u 3 months for Depo shot   --------------------------------------------------------------------    Trey Sailors, PA-C  [facility]

## 2019-05-21 DIAGNOSIS — F410 Panic disorder [episodic paroxysmal anxiety] without agoraphobia: Secondary | ICD-10-CM | POA: Diagnosis not present

## 2019-05-21 DIAGNOSIS — F411 Generalized anxiety disorder: Secondary | ICD-10-CM | POA: Diagnosis not present

## 2019-05-21 DIAGNOSIS — F331 Major depressive disorder, recurrent, moderate: Secondary | ICD-10-CM | POA: Diagnosis not present

## 2019-05-21 LAB — CYTOLOGY - PAP
Chlamydia: NEGATIVE
Diagnosis: NEGATIVE
Neisseria Gonorrhea: NEGATIVE

## 2019-05-24 ENCOUNTER — Telehealth: Payer: Self-pay

## 2019-05-24 ENCOUNTER — Encounter: Payer: Self-pay | Admitting: Physician Assistant

## 2019-05-24 NOTE — Telephone Encounter (Signed)
Pt advised.   Thanks,   -Laura  

## 2019-05-24 NOTE — Telephone Encounter (Signed)
-----   Message from Trinna Post, [place] sent at 05/24/2019 10:24 AM EDT ----- PAP negative, STI screening negative. Please abstract for metric.

## 2019-05-29 ENCOUNTER — Encounter: Payer: Medicaid Other | Admitting: Physician Assistant

## 2019-06-05 ENCOUNTER — Ambulatory Visit (INDEPENDENT_AMBULATORY_CARE_PROVIDER_SITE_OTHER): Payer: Medicaid Other | Admitting: Physician Assistant

## 2019-06-05 ENCOUNTER — Other Ambulatory Visit: Payer: Self-pay

## 2019-06-05 DIAGNOSIS — M545 Low back pain, unspecified: Secondary | ICD-10-CM

## 2019-06-05 DIAGNOSIS — G8929 Other chronic pain: Secondary | ICD-10-CM | POA: Diagnosis not present

## 2019-06-05 DIAGNOSIS — M25512 Pain in left shoulder: Secondary | ICD-10-CM

## 2019-06-05 MED ORDER — MELOXICAM 15 MG PO TABS: 15.0000 mg | ORAL_TABLET | Freq: Every day | ORAL | 0 refills | Status: DC

## 2019-06-05 MED ORDER — METHOCARBAMOL 500 MG PO TABS: 500.0000 mg | ORAL_TABLET | Freq: Three times a day (TID) | ORAL | 0 refills | Status: DC | PRN

## 2019-06-05 NOTE — Progress Notes (Signed)
Patient: Karen Huang Female    DOB: Oct 09, 1995   23 y.o.   MRN: [id number] Visit Date: 06/05/2019  Today's Provider: Trinna Post, PA-C   Chief Complaint  Patient presents with  . Back Pain   Subjective:    Patient presents today for follow up of back pain and shoulder pain. She has been seen for this prior. She reports her back pain starts in her low back and worsens with movement. She reports there is a pain that radiates up her back. Reports pain will become so bad she will start shaking and feel dizzy. She was seen for this in 02/2019 and given flexeril and gabapentin. She reports neither of these worked.   She was seen for shoulder pain 04/2019 and diagnosed with muscle strain. She was instructed to use aspercreme and lidocain. Labwork including CBC, Sed rate and BMET were normal.   Back Pain This is a recurrent problem. The current episode started more than 1 year ago. The problem occurs intermittently. The problem has been gradually worsening since onset. The pain is present in the lumbar spine. The quality of the pain is described as shooting. The pain is severe (with movement). The pain is the same all the time. The symptoms are aggravated by twisting and sitting. Stiffness is present all day (in left shoulder area). She has tried analgesics for the symptoms. The treatment provided no relief.    Allergies  Allergen Reactions  . Amoxicillin Rash    Allergy is from childhood; was taken to the hospital by her mother Has patient had a PCN reaction causing immediate rash, facial/tongue/throat swelling, SOB or lightheadedness with hypotension: Unknown Has patient had a PCN reaction causing severe rash involving mucus membranes or skin necrosis: No Has patient had a PCN reaction that required hospitalization: No Has patient had a PCN reaction occurring within the last 10 years: No If all of the above answers are "NO", then may proceed with Cephalos     Current  Outpatient Medications:  .  albuterol (PROVENTIL HFA;VENTOLIN HFA) 108 (90 Base) MCG/ACT inhaler, Inhale 2 puffs into the lungs every 6 (six) hours as needed for wheezing or shortness of breath., Disp: , Rfl:  .  ALPRAZolam (XANAX) 0.5 MG tablet, Take 1 tablet by mouth 2 (two) times daily as needed., Disp: , Rfl:  .  FLUoxetine HCl 60 MG TABS, Take 1 tablet by mouth daily., Disp: , Rfl:   Review of Systems  Constitutional: Negative.   Cardiovascular: Negative.   Musculoskeletal: Positive for back pain, myalgias and neck stiffness.    Social History   Tobacco Use  . Smoking status: Former Smoker    Types: E-cigarettes    Quit date: 11/17/2017    Years since quitting: 1.5  . Smokeless tobacco: Never Used  Substance Use Topics  . Alcohol use: No      Objective:   BP 110/71 (BP Location: Left Arm, Patient Position: Sitting, Cuff Size: Normal)   Pulse 89   Temp (!) 97.1 F (36.2 C) (Temporal)   Resp 16   Wt 130 lb (59 kg)   BMI 20.98 kg/m  Vitals:   06/05/19 1545  BP: 110/71  Pulse: 89  Resp: 16  Temp: (!) 97.1 F (36.2 C)  TempSrc: Temporal  Weight: 130 lb (59 kg)  Body mass index is 20.98 kg/m.   Physical Exam Constitutional:      Appearance: Normal appearance.  Cardiovascular:     Rate and  Rhythm: Normal rate and regular rhythm.  Musculoskeletal:     Lumbar back: She exhibits pain.       Back:       Arms:  Skin:    General: Skin is warm and dry.  Neurological:     Mental Status: She is alert and oriented to person, place, and time. Mental status is at baseline.  Psychiatric:        Mood and Affect: Mood normal.        Behavior: Behavior normal.      No results found for any visits on 06/05/19.     Assessment & Plan    1. Chronic left shoulder pain  Not improving. Suggest PT and possibly referral to ortho. Will try different muscle relaxer and anti-inflammatory as below.  - DG Shoulder Left; Future - Ambulatory referral to Physical Therapy -  meloxicam (MOBIC) 15 MG tablet; Take 1 tablet (15 mg total) by mouth daily.  Dispense: 30 tablet; Refill: 0 - methocarbamol (ROBAXIN) 500 MG tablet; Take 1 tablet (500 mg total) by mouth every 8 (eight) hours as needed for muscle spasms.  Dispense: 30 tablet; Refill: 0  2. Chronic bilateral low back pain without sciatica  - DG Lumbar Spine Complete; Future - Ambulatory referral to Physical Therapy - meloxicam (MOBIC) 15 MG tablet; Take 1 tablet (15 mg total) by mouth daily.  Dispense: 30 tablet; Refill: 0 - methocarbamol (ROBAXIN) 500 MG tablet; Take 1 tablet (500 mg total) by mouth every 8 (eight) hours as needed for muscle spasms.  Dispense: 30 tablet; Refill: 0  The entirety of the information documented in the History of Present Illness, Review of Systems and Physical Exam were personally obtained by me. Portions of this information were initially documented by Rondel Baton, CMA and reviewed by me for thoroughness and accuracy.   F/u PRN     Trey Sailors, PA-C  [facility]

## 2019-06-05 NOTE — Patient Instructions (Signed)
Acute Back Pain, Adult Acute back pain is sudden and usually short-lived. It is often caused by an injury to the muscles and tissues in the back. The injury may result from:  A muscle or ligament getting overstretched or torn (strained). Ligaments are tissues that connect bones to each other. Lifting something improperly can cause a back strain.  Wear and tear (degeneration) of the spinal disks. Spinal disks are circular tissue that provides cushioning between the bones of the spine (vertebrae).  Twisting motions, such as while playing sports or doing yard work.  A hit to the back.  Arthritis. You may have a physical exam, lab tests, and imaging tests to find the cause of your pain. Acute back pain usually goes away with rest and home care. Follow these instructions at home: Managing pain, stiffness, and swelling  Take over-the-counter and prescription medicines only as told by your health care provider.  Your health care provider may recommend applying ice during the first 24-48 hours after your pain starts. To do this: ? Put ice in a plastic bag. ? Place a towel between your skin and the bag. ? Leave the ice on for 20 minutes, 2-3 times a day.  If directed, apply heat to the affected area as often as told by your health care provider. Use the heat source that your health care provider recommends, such as a moist heat pack or a heating pad. ? Place a towel between your skin and the heat source. ? Leave the heat on for 20-30 minutes. ? Remove the heat if your skin turns bright red. This is especially important if you are unable to feel pain, heat, or cold. You have a greater risk of getting burned. Activity   Do not stay in bed. Staying in bed for more than 1-2 days can delay your recovery.  Sit up and stand up straight. Avoid leaning forward when you sit, or hunching over when you stand. ? If you work at a desk, sit close to it so you do not need to lean over. Keep your chin tucked  in. Keep your neck drawn back, and keep your elbows bent at a right angle. Your arms should look like the letter "L." ? Sit high and close to the steering wheel when you drive. Add lower back (lumbar) support to your car seat, if needed.  Take short walks on even surfaces as soon as you are able. Try to increase the length of time you walk each day.  Do not sit, drive, or stand in one place for more than 30 minutes at a time. Sitting or standing for long periods of time can put stress on your back.  Do not drive or use heavy machinery while taking prescription pain medicine.  Use proper lifting techniques. When you bend and lift, use positions that put less stress on your back: ? Bend your knees. ? Keep the load close to your body. ? Avoid twisting.  Exercise regularly as told by your health care provider. Exercising helps your back heal faster and helps prevent back injuries by keeping muscles strong and flexible.  Work with a physical therapist to make a safe exercise program, as recommended by your health care provider. Do any exercises as told by your physical therapist. Lifestyle  Maintain a healthy weight. Extra weight puts stress on your back and makes it difficult to have good posture.  Avoid activities or situations that make you feel anxious or stressed. Stress and anxiety increase muscle   tension and can make back pain worse. Learn ways to manage anxiety and stress, such as through exercise. General instructions  Sleep on a firm mattress in a comfortable position. Try lying on your side with your knees slightly bent. If you lie on your back, put a pillow under your knees.  Follow your treatment plan as told by your health care provider. This may include: ? Cognitive or behavioral therapy. ? Acupuncture or massage therapy. ? Meditation or yoga. Contact a health care provider if:  You have pain that is not relieved with rest or medicine.  You have increasing pain going down  into your legs or buttocks.  Your pain does not improve after 2 weeks.  You have pain at night.  You lose weight without trying.  You have a fever or chills. Get help right away if:  You develop new bowel or bladder control problems.  You have unusual weakness or numbness in your arms or legs.  You develop nausea or vomiting.  You develop abdominal pain.  You feel faint. Summary  Acute back pain is sudden and usually short-lived.  Use proper lifting techniques. When you bend and lift, use positions that put less stress on your back.  Take over-the-counter and prescription medicines and apply heat or ice as directed by your health care provider. This information is not intended to replace advice given to you by your health care provider. Make sure you discuss any questions you have with your health care provider. Document Released: 08/22/2005 Document Revised: 12/11/2018 Document Reviewed: 04/05/2017 Elsevier Patient Education  2020 [employer]  

## 2019-06-11 ENCOUNTER — Ambulatory Visit: Payer: Medicaid Other

## 2019-06-17 ENCOUNTER — Ambulatory Visit: Payer: Medicaid Other | Attending: Physician Assistant

## 2019-07-04 DIAGNOSIS — F331 Major depressive disorder, recurrent, moderate: Secondary | ICD-10-CM | POA: Diagnosis not present

## 2019-07-04 DIAGNOSIS — F410 Panic disorder [episodic paroxysmal anxiety] without agoraphobia: Secondary | ICD-10-CM | POA: Diagnosis not present

## 2019-07-04 DIAGNOSIS — F411 Generalized anxiety disorder: Secondary | ICD-10-CM | POA: Diagnosis not present

## 2019-07-15 DIAGNOSIS — F410 Panic disorder [episodic paroxysmal anxiety] without agoraphobia: Secondary | ICD-10-CM | POA: Diagnosis not present

## 2019-07-15 DIAGNOSIS — F411 Generalized anxiety disorder: Secondary | ICD-10-CM | POA: Diagnosis not present

## 2019-07-15 DIAGNOSIS — F331 Major depressive disorder, recurrent, moderate: Secondary | ICD-10-CM | POA: Diagnosis not present

## 2019-07-26 ENCOUNTER — Ambulatory Visit: Payer: Self-pay

## 2019-07-26 NOTE — Telephone Encounter (Signed)
Outgoing call to Patient who states that she has diarrhea so far its been 3 stools today. Onset is today.  Rates it medium diarrhea stools.    Denies antibiotics  Denies traveling to another country . Reports dry chapped lips.  Vomited once today.   On menstrual cycle now. Reviewed Protocol with Patient.  Recommended that Patient drink fluid every hour.  To keep herself  hydrated.   Denies fever.  Encouraged to call back if Sx worsen.  Patient voices understanding.            Reason for Disposition . [1] MILD diarrhea (e.g., 1-3 or more stools than normal in past 24 hours) without known cause AND [2] present >  7 days  Answer Assessment - Initial Assessment Questions 1. DIARRHEA SEVERITY: "How bad is the diarrhea?" "How many extra stools have you had in the past 24 hours than normal?"    - NO DIARRHEA (SCALE 0)   - MILD (SCALE 1-3): Few loose or mushy BMs; increase of 1-3 stools over normal daily number of stools; mild increase in ostomy output.   -  MODERATE (SCALE 4-7): Increase of 4-6 stools daily over normal; moderate increase in ostomy output. * SEVERE (SCALE 8-10; OR 'WORST POSSIBLE'): Increase of 7 or more stools daily over normal; moderate increase in ostomy output; incontinence.     3 times starting today 2. ONSET: "When did the diarrhea begin?"      Just starting todayy 3. BM CONSISTENCY: "How loose or watery is the diarrhea?"     Medium water 4. VOMITING: "Are you also vomiting?" If so, ask: "How many times in the past 24 hours?"      2 times 5. ABDOMINAL PAIN: "Are you having any abdominal pain?" If yes: "What does it feel like?" (e.g., crampy, dull, intermittent, constant)      denies 6. ABDOMINAL PAIN SEVERITY: If present, ask: "How bad is the pain?"  (e.g., Scale 1-10; mild, moderate, or severe)   - MILD (1-3): doesn't interfere with normal activities, abdomen soft and not tender to touch    - MODERATE (4-7): interferes with normal activities or awakens from sleep, tender to  touch    - SEVERE (8-10): excruciating pain, doubled over, unable to do any normal activities       *denies, stomach  Fells like its sour nausea 7. ORAL INTAKE: If vomiting, "Have you been able to drink liquids?" "How much fluids have you had in the past 24 hours?"    Just 3 water bottles 8. HYDRATION: "Any signs of dehydration?" (e.g., dry mouth [not just dry lips], too weak to stand, dizziness, new weight loss) "When did you last urinate?"   Dry chapped lips, clear yellow urinne 9. EXPOSURE: "Have you traveled to a foreign country recently?" "Have you been exposed to anyone with diarrhea?" "Could you have eaten any food that was spoiled?"   denies 10. ANTIBIOTIC USE: "Are you taking antibiotics now or have you taken antibiotics in the past 2 months?"      denies 11. OTHER SYMPTOMS: "Do you have any other symptoms?" (e.g., fever, blood in stools   Denies 12. PRE stool)       *No Answer*GNANCY: "Is there any chance you are pregnant?" "When was your last menstrual period?"      On cycle right now.  Protocols used: [facility]

## 2019-07-29 DIAGNOSIS — F411 Generalized anxiety disorder: Secondary | ICD-10-CM | POA: Diagnosis not present

## 2019-07-29 DIAGNOSIS — F331 Major depressive disorder, recurrent, moderate: Secondary | ICD-10-CM | POA: Diagnosis not present

## 2019-07-29 DIAGNOSIS — F410 Panic disorder [episodic paroxysmal anxiety] without agoraphobia: Secondary | ICD-10-CM | POA: Diagnosis not present

## 2019-08-05 ENCOUNTER — Other Ambulatory Visit: Payer: Self-pay

## 2019-08-05 ENCOUNTER — Ambulatory Visit (INDEPENDENT_AMBULATORY_CARE_PROVIDER_SITE_OTHER): Payer: Medicaid Other | Admitting: Physician Assistant

## 2019-08-05 VITALS — Temp 97.1°F

## 2019-08-05 DIAGNOSIS — Z3042 Encounter for surveillance of injectable contraceptive: Secondary | ICD-10-CM | POA: Diagnosis not present

## 2019-08-05 MED ORDER — MEDROXYPROGESTERONE ACETATE 150 MG/ML IM SUSP
150.0000 mg | Freq: Once | INTRAMUSCULAR | Status: AC
  Administered 2019-08-05: 150 mg via INTRAMUSCULAR

## 2019-08-05 NOTE — Progress Notes (Signed)
       Patient: Karen Huang Female    DOB: October 25, 1995   23 y.o.   MRN: [id number] Visit Date: 08/05/2019  Today's Provider: Trinna Post, PA-C   Chief Complaint  Patient presents with  . Encounter for Depo-Provera   Subjective:     HPI Patient presents today for Depo-Provera contraception. Patients last injection was on 05/17/2019.   Allergies  Allergen Reactions  . Amoxicillin Rash    Allergy is from childhood; was taken to the hospital by her mother Has patient had a PCN reaction causing immediate rash, facial/tongue/throat swelling, SOB or lightheadedness with hypotension: Unknown Has patient had a PCN reaction causing severe rash involving mucus membranes or skin necrosis: No Has patient had a PCN reaction that required hospitalization: No Has patient had a PCN reaction occurring within the last 10 years: No If all of the above answers are "NO", then may proceed with Cephalos     Current Outpatient Medications:  .  albuterol (PROVENTIL HFA;VENTOLIN HFA) 108 (90 Base) MCG/ACT inhaler, Inhale 2 puffs into the lungs every 6 (six) hours as needed for wheezing or shortness of breath., Disp: , Rfl:  .  ALPRAZolam (XANAX) 0.5 MG tablet, Take 1 tablet by mouth 2 (two) times daily as needed., Disp: , Rfl:  .  FLUoxetine HCl 60 MG TABS, Take 1 tablet by mouth daily., Disp: , Rfl:  .  meloxicam (MOBIC) 15 MG tablet, Take 1 tablet (15 mg total) by mouth daily., Disp: 30 tablet, Rfl: 0 .  methocarbamol (ROBAXIN) 500 MG tablet, Take 1 tablet (500 mg total) by mouth every 8 (eight) hours as needed for muscle spasms., Disp: 30 tablet, Rfl: 0  Review of Systems  Social History   Tobacco Use  . Smoking status: Former Smoker    Types: E-cigarettes    Quit date: 11/17/2017    Years since quitting: 1.7  . Smokeless tobacco: Never Used  Substance Use Topics  . Alcohol use: No      Objective:   Temp (!) 97.1 F (36.2 C) (Temporal)  Vitals:   08/05/19 1455  Temp: (!)  97.1 F (36.2 C)  TempSrc: Temporal  There is no height or weight on file to calculate BMI.   Physical Exam   No results found for any visits on 08/05/19.     Assessment & Plan    1. Encounter for Depo-Provera contraception  Patient sat for ten minutes and tolerated vaccine well. F/u 3 months.   - medroxyPROGESTERone (DEPO-PROVERA) injection 150 mg      Trinna Post, PA-C  [facility]

## 2019-08-28 DIAGNOSIS — F410 Panic disorder [episodic paroxysmal anxiety] without agoraphobia: Secondary | ICD-10-CM | POA: Diagnosis not present

## 2019-08-28 DIAGNOSIS — F411 Generalized anxiety disorder: Secondary | ICD-10-CM | POA: Diagnosis not present

## 2019-08-28 DIAGNOSIS — F331 Major depressive disorder, recurrent, moderate: Secondary | ICD-10-CM | POA: Diagnosis not present

## 2019-09-03 DIAGNOSIS — F411 Generalized anxiety disorder: Secondary | ICD-10-CM | POA: Diagnosis not present

## 2019-09-03 DIAGNOSIS — F410 Panic disorder [episodic paroxysmal anxiety] without agoraphobia: Secondary | ICD-10-CM | POA: Diagnosis not present

## 2019-09-03 DIAGNOSIS — F331 Major depressive disorder, recurrent, moderate: Secondary | ICD-10-CM | POA: Diagnosis not present

## 2019-09-10 NOTE — Progress Notes (Signed)
Patient: Karen Huang Female    DOB: Nov 14, 1995   24 y.o.   MRN: [id number] Visit Date: 09/11/2019  Today's Provider: Trey Sailors, PA-C   Chief Complaint  Patient presents with  . Abdominal Pain   Subjective:    I, Porsha McClurkin,CMA am acting as a Neurosurgeon for [facility].  Virtual Visit via Video Note  I connected with Karen Huang on 09/11/19 at  8:40 AM EST by a video enabled telemedicine application and verified that I am speaking with the correct person using two identifiers.  Location: Patient: Home Provider: Office    I discussed the limitations of evaluation and management by telemedicine and the availability of in person appointments. The patient expressed understanding and agreed to proceed.  Abdominal Pain This is a recurrent problem. The current episode started more than 1 month ago. The problem occurs daily. The problem has been unchanged. The pain is at a severity of 5/10. The pain is mild. The quality of the pain is sharp. Associated symptoms include diarrhea and nausea. Pertinent negatives include no constipation. The pain is aggravated by urination and bowel movement.   Reports she has had pelvic pain for approximately one year since the birth of her daughter. Reports this is worse when she uses the bathroom. She has been having diarrhea for five days. Having 2 bowel movements a day that are less formed than normal. No blood. No fevers or chills. Some mucous and possible a piece of tissue that came out. She reports she vomited three times. Denies burning when she urinates.   Allergies  Allergen Reactions  . Amoxicillin Rash    Allergy is from childhood; was taken to the hospital by her mother Has patient had a PCN reaction causing immediate rash, facial/tongue/throat swelling, SOB or lightheadedness with hypotension: Unknown Has patient had a PCN reaction causing severe rash involving mucus membranes or skin necrosis: No Has patient  had a PCN reaction that required hospitalization: No Has patient had a PCN reaction occurring within the last 10 years: No If all of the above answers are "NO", then may proceed with Cephalos     Current Outpatient Medications:  .  albuterol (PROVENTIL HFA;VENTOLIN HFA) 108 (90 Base) MCG/ACT inhaler, Inhale 2 puffs into the lungs every 6 (six) hours as needed for wheezing or shortness of breath., Disp: , Rfl:  .  ALPRAZolam (XANAX) 0.5 MG tablet, Take 1 tablet by mouth 2 (two) times daily as needed., Disp: , Rfl:  .  FLUoxetine HCl 60 MG TABS, Take 1 tablet by mouth daily., Disp: , Rfl:  .  meloxicam (MOBIC) 15 MG tablet, Take 1 tablet (15 mg total) by mouth daily., Disp: 30 tablet, Rfl: 0 .  methocarbamol (ROBAXIN) 500 MG tablet, Take 1 tablet (500 mg total) by mouth every 8 (eight) hours as needed for muscle spasms., Disp: 30 tablet, Rfl: 0  Review of Systems  Constitutional: Negative.   Respiratory: Negative.   Gastrointestinal: Positive for abdominal pain, diarrhea and nausea. Negative for constipation.  Neurological: Negative.     Social History   Tobacco Use  . Smoking status: Former Smoker    Types: E-cigarettes    Quit date: 11/17/2017    Years since quitting: 1.8  . Smokeless tobacco: Never Used  Substance Use Topics  . Alcohol use: No      Objective:   There were no vitals taken for this visit. There were no vitals filed for this visit.There is no  height or weight on file to calculate BMI.   Physical Exam Constitutional:      Appearance: She is well-developed. She is not ill-appearing or toxic-appearing.  Neurological:     Mental Status: She is alert.  Psychiatric:        Mood and Affect: Mood normal.        Behavior: Behavior normal.      No results found for any visits on 09/11/19.     Assessment & Plan    1. Diarrhea, unspecified type  Consistent with viral gastroenteritis. Discussed general self limiting nature and symptom management. Discussed  avoiding dairy and return precautions.   - ondansetron (ZOFRAN) 4 MG tablet; Take 1 tablet (4 mg total) by mouth every 8 (eight) hours as needed for nausea or vomiting.  Dispense: 20 tablet; Refill: 0  2. Nausea and vomiting, intractability of vomiting not specified, unspecified vomiting type  - ondansetron (ZOFRAN) 4 MG tablet; Take 1 tablet (4 mg total) by mouth every 8 (eight) hours as needed for nausea or vomiting.  Dispense: 20 tablet; Refill: 0 I discussed the assessment and treatment plan with the patient. The patient was provided an opportunity to ask questions and all were answered. The patient agreed with the plan and demonstrated an understanding of the instructions.   The patient was advised to call back or seek an in-person evaluation if the symptoms worsen or if the condition fails to improve as anticipated.  I provided 15 minutes of non-face-to-face time during this encounter.  The entirety of the information documented in the History of Present Illness, Review of Systems and Physical Exam were personally obtained by me. Portions of this information were initially documented by [facility] and reviewed by me for thoroughness and accuracy.     Trinna Post, PA-C  [facility]

## 2019-09-11 ENCOUNTER — Telehealth (INDEPENDENT_AMBULATORY_CARE_PROVIDER_SITE_OTHER): Payer: Medicaid Other | Admitting: Physician Assistant

## 2019-09-11 DIAGNOSIS — R197 Diarrhea, unspecified: Secondary | ICD-10-CM

## 2019-09-11 DIAGNOSIS — R112 Nausea with vomiting, unspecified: Secondary | ICD-10-CM | POA: Diagnosis not present

## 2019-09-11 MED ORDER — ONDANSETRON HCL 4 MG PO TABS: 4.0000 mg | ORAL_TABLET | Freq: Three times a day (TID) | ORAL | 0 refills | Status: DC | PRN

## 2019-09-13 DIAGNOSIS — F410 Panic disorder [episodic paroxysmal anxiety] without agoraphobia: Secondary | ICD-10-CM | POA: Diagnosis not present

## 2019-09-13 DIAGNOSIS — F411 Generalized anxiety disorder: Secondary | ICD-10-CM | POA: Diagnosis not present

## 2019-09-13 DIAGNOSIS — F331 Major depressive disorder, recurrent, moderate: Secondary | ICD-10-CM | POA: Diagnosis not present

## 2019-10-05 DIAGNOSIS — Z131 Encounter for screening for diabetes mellitus: Secondary | ICD-10-CM | POA: Diagnosis not present

## 2019-10-05 DIAGNOSIS — H539 Unspecified visual disturbance: Secondary | ICD-10-CM | POA: Diagnosis not present

## 2019-10-05 DIAGNOSIS — R030 Elevated blood-pressure reading, without diagnosis of hypertension: Secondary | ICD-10-CM | POA: Diagnosis not present

## 2019-10-05 DIAGNOSIS — R519 Headache, unspecified: Secondary | ICD-10-CM | POA: Diagnosis not present

## 2019-10-05 DIAGNOSIS — R251 Tremor, unspecified: Secondary | ICD-10-CM | POA: Diagnosis not present

## 2019-10-24 DIAGNOSIS — F331 Major depressive disorder, recurrent, moderate: Secondary | ICD-10-CM | POA: Diagnosis not present

## 2019-10-24 DIAGNOSIS — F411 Generalized anxiety disorder: Secondary | ICD-10-CM | POA: Diagnosis not present

## 2019-10-24 DIAGNOSIS — F410 Panic disorder [episodic paroxysmal anxiety] without agoraphobia: Secondary | ICD-10-CM | POA: Diagnosis not present

## 2019-10-30 ENCOUNTER — Other Ambulatory Visit: Payer: Self-pay

## 2019-10-30 ENCOUNTER — Ambulatory Visit (INDEPENDENT_AMBULATORY_CARE_PROVIDER_SITE_OTHER): Payer: Medicaid Other | Admitting: Physician Assistant

## 2019-10-30 ENCOUNTER — Encounter: Payer: Self-pay | Admitting: Physician Assistant

## 2019-10-30 VITALS — Temp 96.8°F

## 2019-10-30 DIAGNOSIS — Z3042 Encounter for surveillance of injectable contraceptive: Secondary | ICD-10-CM | POA: Diagnosis not present

## 2019-10-30 MED ORDER — MEDROXYPROGESTERONE ACETATE 150 MG/ML IM SUSP
150.0000 mg | Freq: Once | INTRAMUSCULAR | Status: AC
  Administered 2019-10-30: 150 mg via INTRAMUSCULAR

## 2019-10-30 NOTE — Progress Notes (Signed)
       Patient: Karen Huang Female    DOB: Jun 07, 1996   24 y.o.   MRN: [id number] Visit Date: 10/30/2019  Today's Provider: Trey Sailors, PA-C   Chief Complaint  Patient presents with  . Encounter for Depo-Provera   Subjective:    I, Jonaya Freshour,CMA am acting as a scribe for [facility].  HPI  Encounter for Depo-Provera Patient presents today for Depo-Provera contraception. Patients last injection was on 08/05/2019. Patient received injection in right ventrogluteal and tolerated it well.  Allergies  Allergen Reactions  . Amoxicillin Rash    Allergy is from childhood; was taken to the hospital by her mother Has patient had a PCN reaction causing immediate rash, facial/tongue/throat swelling, SOB or lightheadedness with hypotension: Unknown Has patient had a PCN reaction causing severe rash involving mucus membranes or skin necrosis: No Has patient had a PCN reaction that required hospitalization: No Has patient had a PCN reaction occurring within the last 10 years: No If all of the above answers are "NO", then may proceed with Cephalos     Current Outpatient Medications:  .  albuterol (PROVENTIL HFA;VENTOLIN HFA) 108 (90 Base) MCG/ACT inhaler, Inhale 2 puffs into the lungs every 6 (six) hours as needed for wheezing or shortness of breath., Disp: , Rfl:  .  ALPRAZolam (XANAX) 0.5 MG tablet, Take 1 tablet by mouth 2 (two) times daily as needed., Disp: , Rfl:  .  FLUoxetine HCl 60 MG TABS, Take 1 tablet by mouth daily., Disp: , Rfl:  .  meloxicam (MOBIC) 15 MG tablet, Take 1 tablet (15 mg total) by mouth daily., Disp: 30 tablet, Rfl: 0 .  methocarbamol (ROBAXIN) 500 MG tablet, Take 1 tablet (500 mg total) by mouth every 8 (eight) hours as needed for muscle spasms., Disp: 30 tablet, Rfl: 0 .  ondansetron (ZOFRAN) 4 MG tablet, Take 1 tablet (4 mg total) by mouth every 8 (eight) hours as needed for nausea or vomiting., Disp: 20 tablet, Rfl: 0  Review of Systems   Social History   Tobacco Use  . Smoking status: Former Smoker    Types: E-cigarettes    Quit date: 11/17/2017    Years since quitting: 1.9  . Smokeless tobacco: Never Used  Substance Use Topics  . Alcohol use: No      Objective:   Temp (!) 96.8 F (36 C) (Temporal)  Vitals:   10/30/19 1331  Temp: (!) 96.8 F (36 C)  TempSrc: Temporal  There is no height or weight on file to calculate BMI.   Physical Exam   No results found for any visits on 10/30/19.     Assessment & Plan        Trey Sailors, PA-C  [facility]

## 2019-11-19 DIAGNOSIS — Z20828 Contact with and (suspected) exposure to other viral communicable diseases: Secondary | ICD-10-CM | POA: Diagnosis not present

## 2020-01-02 ENCOUNTER — Telehealth: Payer: Self-pay

## 2020-01-02 NOTE — Telephone Encounter (Signed)
Copied from CRM [id number]. Topic: General - Inquiry >> Jan 01, 2020  4:19 PM Wyonia Hough E wrote: Reason for CRM: Pt went to the [facility] for her [facility]/ Pt missed 2 aapts with the therapist there due to work and was discharged/ Pt is almost of out her medication and wants to know if this office can treat her and prescribe it or if they will send her to another center for it/ please advise

## 2020-01-02 NOTE — Telephone Encounter (Signed)
Patient advised. She preferred a OV with Karen Huang. Patient scheduled for 01/03/2020

## 2020-01-02 NOTE — Telephone Encounter (Signed)
I do not refill Xanax. If she would like to make an appointment to discuss alternatives she may. Or I can place another referral, but another provider may also not provide Xanax and is likely to discuss other options for her. Up to her.

## 2020-01-03 ENCOUNTER — Ambulatory Visit (INDEPENDENT_AMBULATORY_CARE_PROVIDER_SITE_OTHER): Payer: Self-pay | Admitting: Physician Assistant

## 2020-01-03 ENCOUNTER — Encounter: Payer: Self-pay | Admitting: Physician Assistant

## 2020-01-03 ENCOUNTER — Other Ambulatory Visit: Payer: Self-pay

## 2020-01-03 VITALS — BP 113/76 | HR 73 | Temp 96.9°F | Wt 139.0 lb

## 2020-01-03 DIAGNOSIS — F419 Anxiety disorder, unspecified: Secondary | ICD-10-CM

## 2020-01-03 MED ORDER — HYDROXYZINE HCL 10 MG PO TABS: 10.0000 mg | ORAL_TABLET | Freq: Three times a day (TID) | ORAL | 1 refills | Status: DC | PRN

## 2020-01-03 MED ORDER — FLUOXETINE HCL 60 MG PO TABS: 1.0000 | ORAL_TABLET | Freq: Every day | ORAL | 1 refills | Status: DC

## 2020-01-03 NOTE — Patient Instructions (Signed)

## 2020-01-03 NOTE — Progress Notes (Signed)
Established patient visit   Patient: Karen Huang   DOB: [birth year]   23 y.o. Female  MRN: [id number]   Today's healthcare provider: Trinna Post, PA-C   Chief Complaint  Patient presents with  . Anxiety   Subjective    HPI  Anxiety & Depression Patient presents today for anxiety and depression. Patient is currently taking Xanax 0.5 MG twice daily and Mobic 15 MG daily.    Patient presents today for a medication refill, patient recently discharged from psychiatrist. She has been on fluoxetine for a year and is doing well on it. She reports anxiety and depression started after birth of child.      Medications: Outpatient Medications Prior to Visit  Medication Sig  . albuterol (PROVENTIL HFA;VENTOLIN HFA) 108 (90 Base) MCG/ACT inhaler Inhale 2 puffs into the lungs every 6 (six) hours as needed for wheezing or shortness of breath.  . ALPRAZolam (XANAX) 0.5 MG tablet Take 1 tablet by mouth 2 (two) times daily as needed.  . [DISCONTINUED] FLUoxetine HCl 60 MG TABS Take 1 tablet by mouth daily.  . meloxicam (MOBIC) 15 MG tablet Take 1 tablet (15 mg total) by mouth daily. (Patient not taking: Reported on 01/03/2020)  . methocarbamol (ROBAXIN) 500 MG tablet Take 1 tablet (500 mg total) by mouth every 8 (eight) hours as needed for muscle spasms. (Patient not taking: Reported on 01/03/2020)  . ondansetron (ZOFRAN) 4 MG tablet Take 1 tablet (4 mg total) by mouth every 8 (eight) hours as needed for nausea or vomiting. (Patient not taking: Reported on 01/03/2020)   No facility-administered medications prior to visit.   GAD 7 : Generalized Anxiety Score 01/03/2020  Nervous, Anxious, on Edge 3  Control/stop worrying 2  Worry too much - different things 2  Trouble relaxing 0  Restless 1  Easily annoyed or irritable 1  Afraid - awful might happen 3  Total GAD 7 Score 12  Anxiety Difficulty Somewhat difficult    Review of Systems    Objective    BP 113/76 (BP Location:  Left Arm, Patient Position: Sitting, Cuff Size: Normal)   Pulse 73   Temp (!) 96.9 F (36.1 C) (Temporal)   Wt 139 lb (63 kg)   BMI 22.44 kg/m    Physical Exam Constitutional:      Appearance: Normal appearance.  Cardiovascular:     Rate and Rhythm: Normal rate and regular rhythm.     Pulses: Normal pulses.     Heart sounds: Normal heart sounds.  Pulmonary:     Effort: Pulmonary effort is normal.     Breath sounds: Normal breath sounds.  Skin:    General: Skin is warm and dry.  Neurological:     Mental Status: She is alert.       No results found for any visits on 01/03/20.  Assessment & Plan    1. Anxiety Referral placed to Psychology for patient to see a new psychologist because patient was discharged from previous. Continue prozac.  Patient is to follow up once a year for Hydroxyzine and prozac. Advised that I do not prescribe xanax.   - FLUoxetine HCl 60 MG TABS; Take 1 tablet by mouth daily.  Dispense: 90 tablet; Refill: 1 - hydrOXYzine (ATARAX/VISTARIL) 10 MG tablet; Take 1 tablet (10 mg total) by mouth 3 (three) times daily as needed.  Dispense: 30 tablet; Refill: 1 Return if symptoms worsen or fail to improve.      I, Trinna Post,  PA-C, have reviewed all documentation for this visit. The documentation on 01/03/20 for the exam, diagnosis, procedures, and orders are all accurate and complete.   Maryella Shivers  [facility] [phone] (phone) [phone] (fax)  [facility]

## 2020-01-15 ENCOUNTER — Ambulatory Visit: Payer: Medicaid Other | Admitting: Physician Assistant

## 2020-01-15 NOTE — Progress Notes (Deleted)
     Established patient visit   Patient: Karen Huang   DOB: 29-Sep-1995   24 y.o. Female  MRN: [id number] Visit Date: 01/15/2020  Today's healthcare provider: Trey Sailors, PA-C   No chief complaint on file.  Subjective    HPI  Depo-Provera    {Show patient history (optional):23778::" "}   Medications: Outpatient Medications Prior to Visit  Medication Sig  . albuterol (PROVENTIL HFA;VENTOLIN HFA) 108 (90 Base) MCG/ACT inhaler Inhale 2 puffs into the lungs every 6 (six) hours as needed for wheezing or shortness of breath.  . ALPRAZolam (XANAX) 0.5 MG tablet Take 1 tablet by mouth 2 (two) times daily as needed.  Marland Kitchen FLUoxetine HCl 60 MG TABS Take 1 tablet by mouth daily.  . hydrOXYzine (ATARAX/VISTARIL) 10 MG tablet Take 1 tablet (10 mg total) by mouth 3 (three) times daily as needed.  . meloxicam (MOBIC) 15 MG tablet Take 1 tablet (15 mg total) by mouth daily. (Patient not taking: Reported on 01/03/2020)  . methocarbamol (ROBAXIN) 500 MG tablet Take 1 tablet (500 mg total) by mouth every 8 (eight) hours as needed for muscle spasms. (Patient not taking: Reported on 01/03/2020)  . ondansetron (ZOFRAN) 4 MG tablet Take 1 tablet (4 mg total) by mouth every 8 (eight) hours as needed for nausea or vomiting. (Patient not taking: Reported on 01/03/2020)   No facility-administered medications prior to visit.    Review of Systems  Constitutional: Negative.   Respiratory: Negative.   Cardiovascular: Negative.   Hematological: Negative.     {Show previous labs (optional):23779::" "}  Objective    There were no vitals taken for this visit. {Show previous vital signs (optional):23777::" "}  Physical Exam  ***  No results found for any visits on 01/15/20.  Assessment & Plan     ***  No follow-ups on file.      {provider attestation***:1}   Maryella Shivers  [facility] [phone] (phone) [phone] (fax)  [facility]

## 2020-02-19 ENCOUNTER — Other Ambulatory Visit: Payer: Self-pay

## 2020-02-21 ENCOUNTER — Encounter: Payer: Self-pay | Admitting: Adult Health

## 2020-02-21 ENCOUNTER — Telehealth (INDEPENDENT_AMBULATORY_CARE_PROVIDER_SITE_OTHER): Payer: Medicaid Other | Admitting: Adult Health

## 2020-02-21 VITALS — Temp 99.1°F | Wt 140.0 lb

## 2020-02-21 DIAGNOSIS — J069 Acute upper respiratory infection, unspecified: Secondary | ICD-10-CM | POA: Diagnosis not present

## 2020-02-21 DIAGNOSIS — Z20822 Contact with and (suspected) exposure to covid-19: Secondary | ICD-10-CM | POA: Diagnosis not present

## 2020-02-21 DIAGNOSIS — R062 Wheezing: Secondary | ICD-10-CM | POA: Insufficient documentation

## 2020-02-21 MED ORDER — ALBUTEROL SULFATE HFA 108 (90 BASE) MCG/ACT IN AERS: 1.0000 | INHALATION_SPRAY | Freq: Four times a day (QID) | RESPIRATORY_TRACT | 0 refills | Status: DC | PRN

## 2020-02-21 MED ORDER — GUAIFENESIN ER 600 MG PO TB12: 600.0000 mg | ORAL_TABLET | Freq: Two times a day (BID) | ORAL | 0 refills | Status: DC | PRN

## 2020-02-21 MED ORDER — BENZONATATE 100 MG PO CAPS: 100.0000 mg | ORAL_CAPSULE | Freq: Three times a day (TID) | ORAL | 0 refills | Status: DC | PRN

## 2020-02-21 NOTE — Patient Instructions (Addendum)
Recommend Covid testing.   Advised patient call the office or your primary care doctor for an appointment if no improvement within 72 hours or if any symptoms change or worsen at any time  Advised ER or urgent Care if after hours or on weekend. Call 911 for emergency symptoms at any time.Patinet verbalized understanding of all instructions given/reviewed and treatment plan and has no further questions or concerns at this time.         Upper Respiratory Infection, Adult An upper respiratory infection (URI) affects the nose, throat, and upper air passages. URIs are caused by germs (viruses). The most common type of URI is often called "the common cold." Medicines cannot cure URIs, but you can do things at home to relieve your symptoms. URIs usually get better within 7-10 days. Follow these instructions at home: Activity  Rest as needed.  If you have a fever, stay home from work or school until your fever is gone, or until your doctor says you may return to work or school. ? You should stay home until you cannot spread the infection anymore (you are not contagious). ? Your doctor may have you wear a face mask so you have less risk of spreading the infection. Relieving symptoms  Gargle with a salt-water mixture 3-4 times a day or as needed. To make a salt-water mixture, completely dissolve -1 tsp of salt in 1 cup of warm water.  Use a cool-mist humidifier to add moisture to the air. This can help you breathe more easily. Eating and drinking   Drink enough fluid to keep your pee (urine) pale yellow.  Eat soups and other clear broths. General instructions   Take over-the-counter and prescription medicines only as told by your doctor. These include cold medicines, fever reducers, and cough suppressants.  Do not use any products that contain nicotine or tobacco. These include cigarettes and e-cigarettes. If you need help quitting, ask your doctor.  Avoid being where people are smoking  (avoid secondhand smoke).  Make sure you get regular shots and get the flu shot every year.  Keep all follow-up visits as told by your doctor. This is important. How to avoid spreading infection to others   Wash your hands often with soap and water. If you do not have soap and water, use hand sanitizer.  Avoid touching your mouth, face, eyes, or nose.  Cough or sneeze into a tissue or your sleeve or elbow. Do not cough or sneeze into your hand or into the air. Contact a doctor if:  You are getting worse, not better.  You have any of these: ? A fever. ? Chills. ? Brown or red mucus in your nose. ? Yellow or brown fluid (discharge)coming from your nose. ? Pain in your face, especially when you bend forward. ? Swollen neck glands. ? Pain with swallowing. ? White areas in the back of your throat. Get help right away if:  You have shortness of breath that gets worse.  You have very bad or constant: ? Headache. ? Ear pain. ? Pain in your forehead, behind your eyes, and over your cheekbones (sinus pain). ? Chest pain.  You have long-lasting (chronic) lung disease along with any of these: ? Wheezing. ? Long-lasting cough. ? Coughing up blood. ? A change in your usual mucus.  You have a stiff neck.  You have changes in your: ? Vision. ? Hearing. ? Thinking. ? Mood. Summary  An upper respiratory infection (URI) is caused by a germ  called a virus. The most common type of URI is often called "the common cold."  URIs usually get better within 7-10 days.  Take over-the-counter and prescription medicines only as told by your doctor. This information is not intended to replace advice given to you by your health care provider. Make sure you discuss any questions you have with your health care provider. Document Revised: 08/30/2018 Document Reviewed: 04/14/2017 Elsevier Patient Education  2020 [employer] Cough, Adult A cough helps to clear your throat and lungs. A cough  may be a sign of an illness or another medical condition. An acute cough may only last 2-3 weeks, while a chronic cough may last 8 or more weeks. Many things can cause a cough. They include:  Germs (viruses or bacteria) that attack the airway.  Breathing in things that bother (irritate) your lungs.  Allergies.  Asthma.  Mucus that runs down the back of your throat (postnasal drip).  Smoking.  Acid backing up from the stomach into the tube that moves food from the mouth to the stomach (gastroesophageal reflux).  Some medicines.  Lung problems.  Other medical conditions, such as heart failure or a blood clot in the lung (pulmonary embolism). Follow these instructions at home: Medicines  Take over-the-counter and prescription medicines only as told by your doctor.  Talk with your doctor before you take medicines that stop a cough (coughsuppressants). Lifestyle   Do not smoke, and try not to be around smoke. Do not use any products that contain nicotine or tobacco, such as cigarettes, e-cigarettes, and chewing tobacco. If you need help quitting, ask your doctor.  Drink enough fluid to keep your pee (urine) pale yellow.  Avoid caffeine.  Do not drink alcohol if your doctor tells you not to drink. General instructions   Watch for any changes in your cough. Tell your doctor about them.  Always cover your mouth when you cough.  Stay away from things that make you cough, such as perfume, candles, campfire smoke, or cleaning products.  If the air is dry, use a cool mist vaporizer or humidifier in your home.  If your cough is worse at night, try using extra pillows to raise your head up higher while you sleep.  Rest as needed.  Keep all follow-up visits as told by your doctor. This is important. Contact a doctor if:  You have new symptoms.  You cough up pus.  Your cough does not get better after 2-3 weeks, or your cough gets worse.  Cough medicine does not help  your cough and you are not sleeping well.  You have pain that gets worse or pain that is not helped with medicine.  You have a fever.  You are losing weight and you do not know why.  You have night sweats. Get help right away if:  You cough up blood.  You have trouble breathing.  Your heartbeat is very fast. These symptoms may be an emergency. Do not wait to see if the symptoms will go away. Get medical help right away. Call your local emergency services (911 in the [place] Do not drive yourself to the hospital. Summary  A cough helps to clear your throat and lungs. Many things can cause a cough.  Take over-the-counter and prescription medicines only as told by your doctor.  Always cover your mouth when you cough.  Contact a doctor if you have new symptoms or you have a cough that does not get better or gets worse. This  information is not intended to replace advice given to you by your health care provider. Make sure you discuss any questions you have with your health care provider. Document Revised: 09/10/2018 Document Reviewed: 09/10/2018 Elsevier Patient Education  2020 [employer] Inc[employer] Albuterol inhalation aerosol What is this medicine? ALBUTEROL (al Gaspar Bidding) is a bronchodilator. It helps open up the airways in your lungs to make it easier to breathe. This medicine is used to treat and to prevent bronchospasm. This medicine may be used for other purposes; ask your health care provider or pharmacist if you have questions. COMMON BRAND NAME(S): Proair HFA, Proventil, Proventil HFA, Respirol, Ventolin, Ventolin HFA What should I tell my health care provider before I take this medicine? They need to know if you have any of the following conditions:  diabetes  heart disease or irregular heartbeat  high blood pressure  pheochromocytoma  seizures  thyroid disease  an unusual or allergic reaction to albuterol, levalbuterol, other medicines, foods, dyes, or  preservatives  pregnant or trying to get pregnant  breast-feeding How should I use this medicine? This medicine is for inhalation through the mouth. Follow the directions on your prescription label. Take your medicine at regular intervals. Do not use more often than directed. Make sure that you are using your inhaler correctly. Ask your doctor or health care provider if you have any questions. Talk to your pediatrician regarding the use of this medicine in children. While this drug may be prescribed for children as young as 4 years for selected conditions, precautions do apply. Overdosage: If you think you have taken too much of this medicine contact a poison control center or emergency room at once. NOTE: This medicine is only for you. Do not share this medicine with others. What if I miss a dose? If you miss a dose, use it as soon as you can. If it is almost time for your next dose, use only that dose. Do not use double or extra doses. What may interact with this medicine?  anti-infectives like chloroquine and pentamidine  caffeine  cisapride  diuretics  medicines for colds  medicines for depression or for emotional or psychotic conditions  medicines for weight loss including some herbal products  methadone  some antibiotics like clarithromycin, erythromycin, levofloxacin, and linezolid  some heart medicines  steroid hormones like dexamethasone, cortisone, hydrocortisone  theophylline  thyroid hormones This list may not describe all possible interactions. Give your health care provider a list of all the medicines, herbs, non-prescription drugs, or dietary supplements you use. Also tell them if you smoke, drink alcohol, or use illegal drugs. Some items may interact with your medicine. What should I watch for while using this medicine? Tell your doctor or health care professional if your symptoms do not improve. Do not use extra albuterol. If your asthma or bronchitis gets  worse while you are using this medicine, call your doctor right away. If your mouth gets dry try chewing sugarless gum or sucking hard candy. Drink water as directed. What side effects may I notice from receiving this medicine? Side effects that you should report to your doctor or health care professional as soon as possible:  allergic reactions like skin rash, itching or hives, swelling of the face, lips, or tongue  breathing problems  chest pain  feeling faint or lightheaded, falls  high blood pressure  irregular heartbeat  fever  muscle cramps or weakness  pain, tingling, numbness in the hands or feet  vomiting Side effects  that usually do not require medical attention (report to your doctor or health care professional if they continue or are bothersome):  changes in taste  cough  dry mouth  headache  nervousness or trembling  stomach upset  stuffy or runny nose  throat irritation  trouble sleeping This list may not describe all possible side effects. Call your doctor for medical advice about side effects. You may report side effects to FDA at 1-800-FDA-1088. Where should I keep my medicine? Keep out of the reach of children. Store Proventil HFA and ProAir HFA at room temperature between 15 and 25 degrees C (59 and 77 degrees F). Store Ventolin HFA at room temperature between 20 and 25 degrees C (68 and 77 degrees F); it may be stored between 15 and 30 degrees C (59 and 86 degrees F) on occasion. The contents are under pressure and may burst when exposed to heat or flame. Do not freeze. This medicine does not work as well if it is too cold. Throw away the inhaler when the dose counter displays "0" or after the expiration date on the package, whichever comes first. Ventolin HFA should be thrown away 12 months after removing it from the foil pouch. NOTE: This sheet is a summary. It may not cover all possible information. If you have questions about this medicine, talk  to your doctor, pharmacist, or health care provider.  2020 Elsevier/Gold Standard (2018-12-06 12:46:54) Guaifenesin oral ER tablets What is this medicine? GUAIFENESIN (gwye FEN e sin) is an expectorant. It helps to thin mucous and make coughs more productive. This medicine is used to treat coughs caused by colds or the flu. It is not intended to treat chronic cough caused by smoking, asthma, emphysema, or heart failure. This medicine may be used for other purposes; ask your health care provider or pharmacist if you have questions. COMMON BRAND NAME(S): Humibid, Mucinex What should I tell my health care provider before I take this medicine? They need to know if you have any of these conditions:  fever  kidney disease  an unusual or allergic reaction to guaifenesin, other medicines, foods, dyes, or preservatives  pregnant or trying to get pregnant  breast-feeding How should I use this medicine? Take this medicine by mouth with a full glass of water. Follow the directions on the prescription label. Do not break, chew or crush this medicine. You may take with food or on an empty stomach. Take your medicine at regular intervals. Do not take your medicine more often than directed. Talk to your pediatrician regarding the use of this medicine in children. While this drug may be prescribed for children as young as 22 years old for selected conditions, precautions do apply. Overdosage: If you think you have taken too much of this medicine contact a poison control center or emergency room at once. NOTE: This medicine is only for you. Do not share this medicine with others. What if I miss a dose? If you miss a dose, take it as soon as you can. If it is almost time for your next dose, take only that dose. Do not take double or extra doses. What may interact with this medicine? Interactions are not expected. This list may not describe all possible interactions. Give your health care provider a list of  all the medicines, herbs, non-prescription drugs, or dietary supplements you use. Also tell them if you smoke, drink alcohol, or use illegal drugs. Some items may interact with your medicine. What should I watch  for while using this medicine? Do not treat a cough for more than 1 week without consulting your doctor or health care professional. If you also have a high fever, skin rash, continuing headache, or sore throat, see your doctor. For best results, drink 6 to 8 glasses water daily while you are taking this medicine. What side effects may I notice from receiving this medicine? Side effects that you should report to your doctor or health care professional as soon as possible:  allergic reactions like skin rash, itching or hives, swelling of the face, lips, or tongue Side effects that usually do not require medical attention (report to your doctor or health care professional if they continue or are bothersome):  dizziness  headache  stomach upset This list may not describe all possible side effects. Call your doctor for medical advice about side effects. You may report side effects to FDA at 1-800-FDA-1088. Where should I keep my medicine? Keep out of the reach of children. Store at room temperature between 20 and 25 degrees C (68 and 77 degrees F). Keep container tightly closed. Throw away any unused medicine after the expiration date. NOTE: This sheet is a summary. It may not cover all possible information. If you have questions about this medicine, talk to your doctor, pharmacist, or health care provider.  2020 Elsevier/Gold Standard (2008-01-02 12:14:14) Albuterol inhalation aerosol What is this medicine? ALBUTEROL (al Gaspar Bidding) is a bronchodilator. It helps open up the airways in your lungs to make it easier to breathe. This medicine is used to treat and to prevent bronchospasm. This medicine may be used for other purposes; ask your health care provider or pharmacist if you have  questions. COMMON BRAND NAME(S): Proair HFA, Proventil, Proventil HFA, Respirol, Ventolin, Ventolin HFA What should I tell my health care provider before I take this medicine? They need to know if you have any of the following conditions:  diabetes  heart disease or irregular heartbeat  high blood pressure  pheochromocytoma  seizures  thyroid disease  an unusual or allergic reaction to albuterol, levalbuterol, other medicines, foods, dyes, or preservatives  pregnant or trying to get pregnant  breast-feeding How should I use this medicine? This medicine is for inhalation through the mouth. Follow the directions on your prescription label. Take your medicine at regular intervals. Do not use more often than directed. Make sure that you are using your inhaler correctly. Ask your doctor or health care provider if you have any questions. Talk to your pediatrician regarding the use of this medicine in children. While this drug may be prescribed for children as young as 4 years for selected conditions, precautions do apply. Overdosage: If you think you have taken too much of this medicine contact a poison control center or emergency room at once. NOTE: This medicine is only for you. Do not share this medicine with others. What if I miss a dose? If you miss a dose, use it as soon as you can. If it is almost time for your next dose, use only that dose. Do not use double or extra doses. What may interact with this medicine?  anti-infectives like chloroquine and pentamidine  caffeine  cisapride  diuretics  medicines for colds  medicines for depression or for emotional or psychotic conditions  medicines for weight loss including some herbal products  methadone  some antibiotics like clarithromycin, erythromycin, levofloxacin, and linezolid  some heart medicines  steroid hormones like dexamethasone, cortisone, hydrocortisone  theophylline  thyroid hormones  This list may not  describe all possible interactions. Give your health care provider a list of all the medicines, herbs, non-prescription drugs, or dietary supplements you use. Also tell them if you smoke, drink alcohol, or use illegal drugs. Some items may interact with your medicine. What should I watch for while using this medicine? Tell your doctor or health care professional if your symptoms do not improve. Do not use extra albuterol. If your asthma or bronchitis gets worse while you are using this medicine, call your doctor right away. If your mouth gets dry try chewing sugarless gum or sucking hard candy. Drink water as directed. What side effects may I notice from receiving this medicine? Side effects that you should report to your doctor or health care professional as soon as possible:  allergic reactions like skin rash, itching or hives, swelling of the face, lips, or tongue  breathing problems  chest pain  feeling faint or lightheaded, falls  high blood pressure  irregular heartbeat  fever  muscle cramps or weakness  pain, tingling, numbness in the hands or feet  vomiting Side effects that usually do not require medical attention (report to your doctor or health care professional if they continue or are bothersome):  changes in taste  cough  dry mouth  headache  nervousness or trembling  stomach upset  stuffy or runny nose  throat irritation  trouble sleeping This list may not describe all possible side effects. Call your doctor for medical advice about side effects. You may report side effects to FDA at 1-800-FDA-1088. Where should I keep my medicine? Keep out of the reach of children. Store Proventil HFA and ProAir HFA at room temperature between 15 and 25 degrees C (59 and 77 degrees F). Store Ventolin HFA at room temperature between 20 and 25 degrees C (68 and 77 degrees F); it may be stored between 15 and 30 degrees C (59 and 86 degrees F) on occasion. The contents are  under pressure and may burst when exposed to heat or flame. Do not freeze. This medicine does not work as well if it is too cold. Throw away the inhaler when the dose counter displays "0" or after the expiration date on the package, whichever comes first. Ventolin HFA should be thrown away 12 months after removing it from the foil pouch. NOTE: This sheet is a summary. It may not cover all possible information. If you have questions about this medicine, talk to your doctor, pharmacist, or health care provider.  2020 Elsevier/Gold Standard (2018-12-06 12:46:54) Benzonatate capsules What is this medicine? BENZONATATE (ben ZOE na tate) is used to treat cough. This medicine may be used for other purposes; ask your health care provider or pharmacist if you have questions. COMMON BRAND NAME(S): Tessalon Perles, Zonatuss What should I tell my health care provider before I take this medicine? They need to know if you have any of these conditions:  kidney or liver disease  an unusual or allergic reaction to benzonatate, anesthetics, other medicines, foods, dyes, or preservatives  pregnant or trying to get pregnant  breast-feeding How should I use this medicine? Take this medicine by mouth with a glass of water. Follow the directions on the prescription label. Avoid breaking, chewing, or sucking the capsule, as this can cause serious side effects. Take your medicine at regular intervals. Do not take your medicine more often than directed. Talk to your pediatrician regarding the use of this medicine in children. While this drug may be prescribed  for children as young as 96 years old for selected conditions, precautions do apply. Overdosage: If you think you have taken too much of this medicine contact a poison control center or emergency room at once. NOTE: This medicine is only for you. Do not share this medicine with others. What if I miss a dose? If you miss a dose, take it as soon as you can. If it  is almost time for your next dose, take only that dose. Do not take double or extra doses. What may interact with this medicine? Do not take this medicine with any of the following medications:  MAOIs like Carbex, Eldepryl, Marplan, Nardil, and Parnate This list may not describe all possible interactions. Give your health care provider a list of all the medicines, herbs, non-prescription drugs, or dietary supplements you use. Also tell them if you smoke, drink alcohol, or use illegal drugs. Some items may interact with your medicine. What should I watch for while using this medicine? Tell your doctor if your symptoms do not improve or if they get worse. If you have a high fever, skin rash, or headache, see your health care professional. You may get drowsy or dizzy. Do not drive, use machinery, or do anything that needs mental alertness until you know how this medicine affects you. Do not sit or stand up quickly, especially if you are an older patient. This reduces the risk of dizzy or fainting spells. What side effects may I notice from receiving this medicine? Side effects that you should report to your doctor or health care professional as soon as possible:  allergic reactions like skin rash, itching or hives, swelling of the face, lips, or tongue  breathing problems  chest pain  confusion or hallucinations  irregular heartbeat  numbness of mouth or throat  seizures Side effects that usually do not require medical attention (report to your doctor or health care professional if they continue or are bothersome):  burning feeling in the eyes  constipation  headache  nasal congestion  stomach upset This list may not describe all possible side effects. Call your doctor for medical advice about side effects. You may report side effects to FDA at 1-800-FDA-1088. Where should I keep my medicine? Keep out of the reach of children. Store at room temperature between 15 and 30 degrees C  (59 and 86 degrees F). Keep tightly closed. Protect from light and moisture. Throw away any unused medicine after the expiration date. NOTE: This sheet is a summary. It may not cover all possible information. If you have questions about this medicine, talk to your doctor, pharmacist, or health care provider.  2020 Elsevier/Gold Standard (2007-11-21 14:52:56)

## 2020-02-21 NOTE — Progress Notes (Signed)
MyChart Video Visit    Virtual Visit via Video Note   This visit type was conducted due to national recommendations for restrictions regarding the COVID-19 Pandemic (e.g. social distancing) in an effort to limit this patient's exposure and mitigate transmission in our community. This patient is at least at moderate risk for complications without adequate follow up. This format is felt to be most appropriate for this patient at this time. Physical exam was limited by quality of the video and audio technology used for the visit.   Patient location: at home  Provider location: Provider: Provider's office at  [facility], [place].      Patient: Karen Huang   DOB: 06-May-1996   24 y.o. Female  MRN: [id number] Visit Date: 02/21/2020  Today's healthcare provider: Jairo Ben, FNP   Chief Complaint  Patient presents with  . Sore Throat   Subjective    Sore Throat  This is a new problem. The current episode started in the past 7 days. The problem has been gradually worsening. Neither side of throat is experiencing more pain than the other. There has been no fever. The pain is moderate. Associated symptoms include congestion, coughing, headaches ("sinus" intermittent none at present. ), a hoarse voice, a plugged ear sensation and swollen glands. Pertinent negatives include no abdominal pain, diarrhea, drooling, ear discharge, ear pain (patient states that she does not have pain but ears are itchy), neck pain, shortness of breath, stridor, trouble swallowing (no trouble pain with swallowing only ) or vomiting. She has had no exposure to strep or mono. She has tried acetaminophen for the symptoms. The treatment provided no relief.   She reports the symptoms started 3 days ago. She also reports she has had some mild diarrhea. Appetite is ok and drinking well. Denies any abdominal pain since having diarrhea prior to that she had some mild cramping.  History  of tonsillectomy. Temperature 100.3 without fever reducers.  Today is 99.1.  Denies any loss of taste or smell. She denies any other recent symptoms or illness, and she also denies any ill contacts other than   Her young child is also sick with similar symptoms she reports.  She has some mild wheezing. Denies any pain.   She has a cough that is worse at night. Productive cough at times with yellow/ clear  mucous.   Patient  denies any  body aches,chills, rash, chest pain, shortness of breath, nausea, vomiting, or diarrhea.  Denies dizziness, lightheadedness, pre syncopal or syncopal episodes.  . No LMP recorded. Patient has had an injection.  None since on Depo. Denies any chance of pregnancy.      Patient Active Problem List   Diagnosis Date Noted  . Viral upper respiratory tract infection 02/21/2020  . Wheezing 02/21/2020  . Hepatitis C antibody test positive 11/28/2018  . PIH (pregnancy induced hypertension), third trimester 06/21/2018  . SVD (spontaneous vaginal delivery) 06/21/2018  . Postpartum care following vaginal delivery 06/21/2018   Past Medical History:  Diagnosis Date  . Allergies   . Anxiety   . Asthma   . Depression   . Headache   . Hx of chlamydia infection    age 24  . SVD (spontaneous vaginal delivery) 06/21/2018   Allergies  Allergen Reactions  . Amoxicillin Rash    Allergy is from childhood; was taken to the hospital by her mother Has patient had a PCN reaction causing immediate rash, facial/tongue/throat swelling, SOB or lightheadedness with hypotension:  Unknown Has patient had a PCN reaction causing severe rash involving mucus membranes or skin necrosis: No Has patient had a PCN reaction that required hospitalization: No Has patient had a PCN reaction occurring within the last 10 years: No If all of the above answers are "NO", then may proceed with Cephalos      Medications: Outpatient Medications Prior to Visit  Medication Sig  . FLUoxetine  HCl 60 MG TABS Take 1 tablet by mouth daily.  . hydrOXYzine (ATARAX/VISTARIL) 10 MG tablet Take 1 tablet (10 mg total) by mouth 3 (three) times daily as needed.  . ALPRAZolam (XANAX) 0.5 MG tablet Take 1 tablet by mouth 2 (two) times daily as needed. (Patient not taking: Reported on 02/21/2020)  . meloxicam (MOBIC) 15 MG tablet Take 1 tablet (15 mg total) by mouth daily. (Patient not taking: Reported on 01/03/2020)  . methocarbamol (ROBAXIN) 500 MG tablet Take 1 tablet (500 mg total) by mouth every 8 (eight) hours as needed for muscle spasms. (Patient not taking: Reported on 01/03/2020)  . ondansetron (ZOFRAN) 4 MG tablet Take 1 tablet (4 mg total) by mouth every 8 (eight) hours as needed for nausea or vomiting. (Patient not taking: Reported on 01/03/2020)  . [DISCONTINUED] albuterol (PROVENTIL HFA;VENTOLIN HFA) 108 (90 Base) MCG/ACT inhaler Inhale 2 puffs into the lungs every 6 (six) hours as needed for wheezing or shortness of breath. (Patient not taking: Reported on 02/21/2020)   No facility-administered medications prior to visit.    Review of Systems  Constitutional: Negative for activity change, appetite change, chills, diaphoresis, fatigue, fever and unexpected weight change.  HENT: Positive for congestion, hoarse voice, postnasal drip, sinus pressure, sinus pain and sore throat. Negative for dental problem, drooling, ear discharge, ear pain (patient states that she does not have pain but ears are itchy), nosebleeds and trouble swallowing (no trouble pain with swallowing only ).   Eyes: Negative for discharge and itching.  Respiratory: Positive for cough and wheezing (occasional ). Negative for apnea, choking, chest tightness, shortness of breath and stridor.   Cardiovascular: Negative.   Gastrointestinal: Negative.  Negative for abdominal pain, diarrhea and vomiting.  Musculoskeletal: Negative.  Negative for neck pain.  Neurological: Positive for headaches ("sinus" intermittent none at present.  ).  Psychiatric/Behavioral: Negative.     Last CBC Lab Results  Component Value Date   WBC 5.3 04/08/2019   HGB 13.3 04/08/2019   HCT 41.4 04/08/2019   MCV 90 04/08/2019   MCH 29.0 04/08/2019   RDW 12.8 04/08/2019   PLT 272 63/09/6008   Last metabolic panel Lab Results  Component Value Date   GLUCOSE 85 04/08/2019   NA 139 04/08/2019   K 4.5 04/08/2019   CL 103 04/08/2019   CO2 21 04/08/2019   BUN 14 04/08/2019   CREATININE 0.72 04/08/2019   GFRNONAA 120 04/08/2019   GFRAA 138 04/08/2019   CALCIUM 10.0 04/08/2019   PROT 7.7 10/17/2018   ALBUMIN 4.7 10/17/2018   BILITOT 0.5 10/17/2018   ALKPHOS 44 10/17/2018   AST 18 10/17/2018   ALT 17 10/17/2018   ANIONGAP 10 10/17/2018      Objective    Temp 99.1 F (37.3 C) (Oral)   Wt 140 lb (63.5 kg)   BMI 22.60 kg/m  BP Readings from Last 3 Encounters:  01/03/20 113/76  06/05/19 110/71  05/17/19 104/70      Physical Exam   Patient is alert and oriented and responsive to questions Engages in conversation with provider. Speaks  in full sentences without any pauses without any shortness of breath or distress.    Assessment & Plan     .Viral upper respiratory tract infection  Wheezing   Meds ordered this encounter  Medications  . albuterol (VENTOLIN HFA) 108 (90 Base) MCG/ACT inhaler    Sig: Inhale 1-2 puffs into the lungs every 6 (six) hours as needed for wheezing or shortness of breath.    Dispense:  18 g    Refill:  0  . benzonatate (TESSALON) 100 MG capsule    Sig: Take 1 capsule (100 mg total) by mouth 3 (three) times daily as needed for cough. May cause drowsiness    Dispense:  30 capsule    Refill:  0  . guaiFENesin (MUCINEX) 600 MG 12 hr tablet    Sig: Take 1 tablet (600 mg total) by mouth 2 (two) times daily as needed.    Dispense:  30 tablet    Refill:  0    Symptomatic treatment for Upper respiratory infection discussed. Medications above reviewed.   Recommend Covid testing given current  Covid 19 pandemic. - resources given for testing.     Return in about 1 week (around 02/28/2020), or if symptoms worsen or fail to improve, for at any time for any worsening symptoms, Go to Emergency room/ urgent care if worse.     I discussed the assessment and treatment plan with the patient. The patient was provided an opportunity to ask questions and all were answered. The patient agreed with the plan and demonstrated an understanding of the instructions.   The patient was advised to call back or seek an in-person evaluation if the symptoms worsen or if the condition fails to improve as anticipated.  I provided 30 minutes of non-face-to-face time during this encounter.  IBeverely Pace Glinda Natzke, FNP, have reviewed all documentation for this visit. The documentation on 02/21/20 for the exam, diagnosis, procedures, and orders are all accurate and complete.   Jairo Ben, FNP [facility] [phone] (phone) [phone] (fax)  [facility]

## 2020-02-24 NOTE — Progress Notes (Signed)
     Established patient visit   Patient: Karen Huang   DOB: Jan 09, 1996   24 y.o. Female  MRN: [id number] Visit Date: 02/25/2020  Today's healthcare provider: Trey Sailors, PA-C   Chief Complaint  Patient presents with  . Encounter for Depo-Provera   Subjective    HPI  Encounter for Depo-Provera Patient presents today for depo-provera injection. Patient last inject was on 2/24/202. A urine pregnancy was done on patient today due to been late for inject.     Medications: Outpatient Medications Prior to Visit  Medication Sig  . albuterol (VENTOLIN HFA) 108 (90 Base) MCG/ACT inhaler Inhale 1-2 puffs into the lungs every 6 (six) hours as needed for wheezing or shortness of breath.  . ALPRAZolam (XANAX) 0.5 MG tablet Take 1 tablet by mouth 2 (two) times daily as needed. (Patient not taking: Reported on 02/21/2020)  . benzonatate (TESSALON) 100 MG capsule Take 1 capsule (100 mg total) by mouth 3 (three) times daily as needed for cough. May cause drowsiness  . FLUoxetine HCl 60 MG TABS Take 1 tablet by mouth daily.  Marland Kitchen guaiFENesin (MUCINEX) 600 MG 12 hr tablet Take 1 tablet (600 mg total) by mouth 2 (two) times daily as needed.  . hydrOXYzine (ATARAX/VISTARIL) 10 MG tablet Take 1 tablet (10 mg total) by mouth 3 (three) times daily as needed.  . meloxicam (MOBIC) 15 MG tablet Take 1 tablet (15 mg total) by mouth daily. (Patient not taking: Reported on 01/03/2020)  . methocarbamol (ROBAXIN) 500 MG tablet Take 1 tablet (500 mg total) by mouth every 8 (eight) hours as needed for muscle spasms. (Patient not taking: Reported on 01/03/2020)  . ondansetron (ZOFRAN) 4 MG tablet Take 1 tablet (4 mg total) by mouth every 8 (eight) hours as needed for nausea or vomiting. (Patient not taking: Reported on 01/03/2020)   No facility-administered medications prior to visit.    Review of Systems  Constitutional: Negative.   Respiratory: Negative.   Cardiovascular: Negative.   Hematological:  Negative.       Objective    Temp (!) 97.3 F (36.3 C) (Temporal)    Physical Exam Constitutional:      Appearance: Normal appearance.  Skin:    General: Skin is warm and dry.  Neurological:     General: No focal deficit present.     Mental Status: She is alert and oriented to person, place, and time.  Psychiatric:        Mood and Affect: Mood normal.        Behavior: Behavior normal.       Results for orders placed or performed in visit on 02/25/20  POCT urine pregnancy  Result Value Ref Range   Preg Test, Ur Negative Negative    Assessment & Plan    1. Encounter for Depo-Provera contraception  - medroxyPROGESTERone (DEPO-PROVERA) injection 150 mg - POCT urine pregnancy  2. Possible pregnancy Pregnancy test negative.  Patient here for depo provera only.  I did not examine the patient.  I did review his medical history, medications, and allergies and vaccine consent form.  CMA gave vaccination. Patient tolerated well.  Maryella Shivers, MPH [facility] 02/26/2020 9:16 AM     Return in about 3 months (around 05/27/2020) for depo.      Maryella Shivers  [facility] [phone] (phone) [phone] (fax)  [facility]

## 2020-02-25 ENCOUNTER — Ambulatory Visit (INDEPENDENT_AMBULATORY_CARE_PROVIDER_SITE_OTHER): Payer: Medicaid Other | Admitting: Physician Assistant

## 2020-02-25 ENCOUNTER — Other Ambulatory Visit: Payer: Self-pay

## 2020-02-25 ENCOUNTER — Encounter: Payer: Self-pay | Admitting: Physician Assistant

## 2020-02-25 VITALS — Temp 97.3°F

## 2020-02-25 DIAGNOSIS — Z3200 Encounter for pregnancy test, result unknown: Secondary | ICD-10-CM | POA: Diagnosis not present

## 2020-02-25 DIAGNOSIS — Z3042 Encounter for surveillance of injectable contraceptive: Secondary | ICD-10-CM

## 2020-02-25 LAB — POCT URINE PREGNANCY: Preg Test, Ur: NEGATIVE

## 2020-02-25 MED ORDER — MEDROXYPROGESTERONE ACETATE 150 MG/ML IM SUSP
150.0000 mg | Freq: Once | INTRAMUSCULAR | Status: AC
  Administered 2020-02-25: 150 mg via INTRAMUSCULAR

## 2020-02-25 MED ORDER — MEDROXYPROGESTERONE ACETATE 150 MG/ML IM SUSP: 150.0000 mg | Freq: Once | INTRAMUSCULAR | Status: DC

## 2020-03-03 ENCOUNTER — Ambulatory Visit: Payer: Self-pay

## 2020-03-03 NOTE — Telephone Encounter (Signed)
Patient called and says she feels her heart is beating hard and it makes her feel shaky, lightheaded and her chest feels heavy. She says the feeling lasts at most 2 hours constantly, but other times it's not as long and constant. She says she feels it more when she drinks coffee, eats sweets and/or smoke. She says a couple of months ago she felt it and went to a medic clinic. She was told her BP was up and she needed to follow up with her doctor, but she says she never did. Now she says she feels it may be her BP up again, because she was having the same symptoms while pregnant in 2019 and her BP was up during pregnancy. She denies CP, nausea, feeling like she will pass out. She says she doesn't have a BP cuff or anything at home to check her pulse. I advised since the chest heaviness and feeling lightheaded to go to the ED for evaluation, care advice given. She verbalized understanding.  Reason for Disposition . Dizziness, lightheadedness, or weakness  Answer Assessment - Initial Assessment Questions 1. DESCRIPTION: "Please describe your heart rate or heartbeat that you are having" (e.g., fast/slow, regular/irregular, skipped or extra beats, "palpitations")     Beating hard and fast 2. ONSET: "When did it start?" (Minutes, hours or days)      For the past month becoming more frequent 3. DURATION: "How long does it last" (e.g., seconds, minutes, hours)     2 hours at the most 4. PATTERN "Does it come and go, or has it been constant since it started?"  "Does it get worse with exertion?"   "Are you feeling it now?"     Constant when it happens, worse with exertion. Right now a little shaky 5. TAP: "Using your hand, can you tap out what you are feeling on a chair or table in front of you, so that I can hear?" (Note: not all patients can do this)       Normal sounding 6. HEART RATE: "Can you tell me your heart rate?" "How many beats in 15 seconds?"  (Note: not all patients can do this)       N/A 7.  RECURRENT SYMPTOM: "Have you ever had this before?" If Yes, ask: "When was the last time?" and "What happened that time?"      Yes when pregnant in 2019; 2 months ago went to a medic clinic and was told to have my BP checked out because it was high 8. CAUSE: "What do you think is causing the palpitations?"     I noticed it happens drinking coffee, eating sweets, smoking makes it worse 9. CARDIAC HISTORY: "Do you have any history of heart disease?" (e.g., heart attack, angina, bypass surgery, angioplasty, arrhythmia)      No 10. OTHER SYMPTOMS: "Do you have any other symptoms?" (e.g., dizziness, chest pain, sweating, difficulty breathing)       Shakiness, weakness, lightheaded, chest feels heavy 11. PREGNANCY: "Is there any chance you are pregnant?" "When was your last menstrual period?"       No, after birth in Oct 2019  Protocols used: HEART RATE AND HEARTBEAT QUESTIONS-A-AH

## 2020-03-03 NOTE — Telephone Encounter (Signed)
Please review. Thanks!  

## 2020-03-05 ENCOUNTER — Ambulatory Visit: Payer: Medicaid Other | Admitting: Physician Assistant

## 2020-03-05 ENCOUNTER — Telehealth: Payer: Self-pay

## 2020-03-05 NOTE — Progress Notes (Deleted)
     Established patient visit   Patient: Karen Huang   DOB: [birth year]   24 y.o. Female  MRN: [id number] Visit Date: 03/06/2020  Today's healthcare provider: Trey Sailors, PA-C   No chief complaint on file.  Subjective    Dizziness    ***  {Show patient history (optional):23778::" "}   Medications: Outpatient Medications Prior to Visit  Medication Sig  . albuterol (VENTOLIN HFA) 108 (90 Base) MCG/ACT inhaler Inhale 1-2 puffs into the lungs every 6 (six) hours as needed for wheezing or shortness of breath.  . ALPRAZolam (XANAX) 0.5 MG tablet Take 1 tablet by mouth 2 (two) times daily as needed. (Patient not taking: Reported on 02/21/2020)  . benzonatate (TESSALON) 100 MG capsule Take 1 capsule (100 mg total) by mouth 3 (three) times daily as needed for cough. May cause drowsiness  . FLUoxetine HCl 60 MG TABS Take 1 tablet by mouth daily.  Marland Kitchen guaiFENesin (MUCINEX) 600 MG 12 hr tablet Take 1 tablet (600 mg total) by mouth 2 (two) times daily as needed.  . hydrOXYzine (ATARAX/VISTARIL) 10 MG tablet Take 1 tablet (10 mg total) by mouth 3 (three) times daily as needed.  . meloxicam (MOBIC) 15 MG tablet Take 1 tablet (15 mg total) by mouth daily. (Patient not taking: Reported on 01/03/2020)  . methocarbamol (ROBAXIN) 500 MG tablet Take 1 tablet (500 mg total) by mouth every 8 (eight) hours as needed for muscle spasms. (Patient not taking: Reported on 01/03/2020)  . ondansetron (ZOFRAN) 4 MG tablet Take 1 tablet (4 mg total) by mouth every 8 (eight) hours as needed for nausea or vomiting. (Patient not taking: Reported on 01/03/2020)   No facility-administered medications prior to visit.    Review of Systems  Constitutional: Negative.   Respiratory: Negative.   Neurological: Positive for dizziness and light-headedness.    {Heme  Chem  Endocrine  Serology  Results Review (optional):23779::" "}  Objective    There were no vitals taken for this visit. {Show previous vital  signs (optional):23777::" "}  Physical Exam  ***  No results found for any visits on 03/06/20.  Assessment & Plan     ***  No follow-ups on file.      {provider attestation***:1}   Maryella Shivers  [facility] [phone] (phone) [phone] (fax)  [facility]

## 2020-03-05 NOTE — Telephone Encounter (Signed)
Called patient to schedule for a visit tomorrow, 03/06/2020 @ 3:40 PM and she agreed with visit.

## 2020-03-05 NOTE — Telephone Encounter (Signed)
Copied from CRM [id number]. Topic: General - Inquiry >> Mar 05, 2020  1:28 PM Leary Roca wrote: Reason for CRM: Pt called in regarding symptoms that she's been having for about 2 years. She stated that her appt was cancelled for today and she was directed to an urgent care and/or ER, However pt wasn't able to go because she had noone to watch her child. Nurse triage was contacted and directed PEC to send a message over to office directly . Marland Kitchen She is wanting to come into office for an appt . Please reach out to pt .

## 2020-03-06 ENCOUNTER — Telehealth: Payer: Self-pay

## 2020-03-06 ENCOUNTER — Ambulatory Visit: Payer: Medicaid Other | Admitting: Physician Assistant

## 2020-03-06 NOTE — Telephone Encounter (Signed)
Copied from CRM [id number]. Topic: Appointment Scheduling - Scheduling Inquiry for Clinic >> Mar 06, 2020  2:01 PM Wyonia Hough E wrote: Reason for CRM: Pt cancelled todays appt and rescheduled for next week/ Pts daughter tested positive for influenza and she called to see if it was still ok for her to come in and have her 64 month old daughter

## 2020-03-11 ENCOUNTER — Telehealth: Payer: Self-pay | Admitting: Physician Assistant

## 2020-03-11 ENCOUNTER — Ambulatory Visit: Payer: Medicaid Other | Admitting: Physician Assistant

## 2020-03-11 NOTE — Telephone Encounter (Signed)
Dismissal letter sent.

## 2020-03-11 NOTE — Progress Notes (Deleted)
     Established patient visit   Patient: Karen Huang   DOB: 06/15/1996   24 y.o. Female  MRN: [id number] Visit Date: 03/11/2020  Today's healthcare provider: Trey Sailors, PA-C   No chief complaint on file.  Subjective    HPI  Patient presents today with dizziness.   {Show patient history (optional):23778::" "}   Medications: Outpatient Medications Prior to Visit  Medication Sig  . albuterol (VENTOLIN HFA) 108 (90 Base) MCG/ACT inhaler Inhale 1-2 puffs into the lungs every 6 (six) hours as needed for wheezing or shortness of breath.  . ALPRAZolam (XANAX) 0.5 MG tablet Take 1 tablet by mouth 2 (two) times daily as needed. (Patient not taking: Reported on 02/21/2020)  . benzonatate (TESSALON) 100 MG capsule Take 1 capsule (100 mg total) by mouth 3 (three) times daily as needed for cough. May cause drowsiness  . FLUoxetine HCl 60 MG TABS Take 1 tablet by mouth daily.  Marland Kitchen guaiFENesin (MUCINEX) 600 MG 12 hr tablet Take 1 tablet (600 mg total) by mouth 2 (two) times daily as needed.  . hydrOXYzine (ATARAX/VISTARIL) 10 MG tablet Take 1 tablet (10 mg total) by mouth 3 (three) times daily as needed.  . meloxicam (MOBIC) 15 MG tablet Take 1 tablet (15 mg total) by mouth daily. (Patient not taking: Reported on 01/03/2020)  . methocarbamol (ROBAXIN) 500 MG tablet Take 1 tablet (500 mg total) by mouth every 8 (eight) hours as needed for muscle spasms. (Patient not taking: Reported on 01/03/2020)  . ondansetron (ZOFRAN) 4 MG tablet Take 1 tablet (4 mg total) by mouth every 8 (eight) hours as needed for nausea or vomiting. (Patient not taking: Reported on 01/03/2020)   No facility-administered medications prior to visit.    Review of Systems  {Heme  Chem  Endocrine  Serology  Results Review (optional):23779::" "}  Objective    There were no vitals taken for this visit. {Show previous vital signs (optional):23777::" "}  Physical Exam  ***  No results found for any visits on  03/11/20.  Assessment & Plan     ***  No follow-ups on file.      {provider attestation***:1}   Maryella Shivers  [facility] [phone] (phone) [phone] (fax)  [facility]

## 2020-03-11 NOTE — Telephone Encounter (Signed)
Error

## 2020-04-06 ENCOUNTER — Other Ambulatory Visit: Payer: Self-pay

## 2020-04-06 ENCOUNTER — Ambulatory Visit (INDEPENDENT_AMBULATORY_CARE_PROVIDER_SITE_OTHER): Payer: Medicaid Other | Admitting: Internal Medicine

## 2020-04-06 ENCOUNTER — Encounter: Payer: Self-pay | Admitting: Internal Medicine

## 2020-04-06 VITALS — BP 125/70 | HR 72 | Ht 65.0 in | Wt 144.7 lb

## 2020-04-06 DIAGNOSIS — Z789 Other specified health status: Secondary | ICD-10-CM

## 2020-04-06 DIAGNOSIS — U070 Vaping-related disorder: Secondary | ICD-10-CM

## 2020-04-06 DIAGNOSIS — I341 Nonrheumatic mitral (valve) prolapse: Secondary | ICD-10-CM | POA: Diagnosis not present

## 2020-04-06 DIAGNOSIS — F419 Anxiety disorder, unspecified: Secondary | ICD-10-CM | POA: Diagnosis not present

## 2020-04-06 MED ORDER — FLUOXETINE HCL 40 MG PO CAPS: 40.0000 mg | ORAL_CAPSULE | Freq: Every day | ORAL | 3 refills | Status: DC

## 2020-04-06 MED ORDER — ALPRAZOLAM 0.5 MG PO TABS: 0.5000 mg | ORAL_TABLET | Freq: Two times a day (BID) | ORAL | 0 refills | Status: DC

## 2020-04-06 NOTE — Progress Notes (Signed)
New Patient Office Visit  Subjective:  Subjective  Patient ID: Karen Huang, female    DOB: 05-Feb-1996  Age: 24 y.o. MRN: [id number]  CC:  Chief Complaint  Patient presents with  . New Patient (Initial Visit)    patient here to establish care     HPI Karen Huang is a 24 y.o. female presenting today to establish care as a new patient.  Today, she states that she began to experience episodes of anxiety with associated lightheadedness, shortness of breath, nausea, and palpitations after having her daughter almost two years ago. Her last episode was a few days ago and lasted an entire day as opposed to a few hours. Symptoms are exacerbated if she does not eat or if she drinks too much coffee.  The patient reports a history of panic attacks, anxiety, and depression. She was being seen by a psychiatrist and was prescribed Prozac 60 mg daily and Xanax prn. She stopped seeing the psychiatrist and states that her previous PCP could not prescribe the Xanax and so she was prescribed Hydroxyzine. She states that she has an appointment scheduled with a new psychiatrist on August 13th.  She also reports left shoulder pain/stiffness for which she has tried physical therapy without improvement. 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  She also has a history of asthma for which she uses an Albuterol inhaler prn. She smokes a vape. 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   Past Medical History:  Diagnosis Date  . Allergies   . Anxiety   . Asthma   . Depression   . Headache   . Hx of chlamydia infection    age 48  . SVD (spontaneous vaginal delivery) 06/21/2018    Past Surgical History:  Procedure Laterality Date  . TONSILLECTOMY      Family History  Problem Relation Age of Onset  . Hypertension Father   . Heart Problems Father   .  Hypertension Sister   . Hypertension Paternal Grandmother   . COPD Paternal Grandmother   . Diabetes Paternal Grandmother   . Heart Problems Paternal Grandmother   . Alcohol abuse Mother   . ADD / ADHD Brother   . COPD Maternal Grandmother     Social History   Socioeconomic History  . Marital status: Single    Spouse name: Not on file  . Number of children: Not on file  . Years of education: Not on file  . Highest education level: Not on file  Occupational History  . Not on file  Tobacco Use  . Smoking status: Current Some Day Smoker    Types: E-cigarettes    Last attempt to quit: 11/17/2017    Years since quitting: 2.3  . Smokeless tobacco: Never Used  Vaping Use  . Vaping Use: Some days  . Last attempt to quit: 11/03/2017  Substance and Sexual Activity  . Alcohol use: No  . Drug use: No  . Sexual activity: Yes    Birth control/protection: None  Other Topics Concern  . Not on file  Social History Narrative  . Not on file   Social Determinants of Health   Financial Resource Strain:   . Difficulty of Paying Living Expenses:   Food Insecurity:   . Worried About Programme researcher, broadcasting/film/video in the Last Year:   . [employer]  of Food in the Last Year:   Transportation Needs:   . Freight forwarder (Medical):   Marland Kitchen Lack of Transportation (Non-Medical):   Physical Activity:   . Days of Exercise per Week:   . Minutes of Exercise per Session:   Stress:   . Feeling of Stress :   Social Connections:   . Frequency of Communication with Friends and Family:   . Frequency of Social Gatherings with Friends and Family:   . Attends Religious Services:   . Active Member of Clubs or Organizations:   . Attends Banker Meetings:   Marland Kitchen Marital Status:   Intimate Partner Violence:   . Fear of Current or Ex-Partner:   . Emotionally Abused:   Marland Kitchen Physically Abused:   . Sexually Abused:      Current Outpatient Medications:  .  ALPRAZolam (XANAX) 1 MG tablet, Take 1 mg by mouth  as needed for anxiety., Disp: , Rfl:  .  hydrOXYzine (ATARAX/VISTARIL) 10 MG tablet, Take 1 tablet (10 mg total) by mouth 3 (three) times daily as needed., Disp: 30 tablet, Rfl: 1 .  ALPRAZolam (XANAX) 0.5 MG tablet, Take 1 tablet (0.5 mg total) by mouth 2 (two) times daily., Disp: 60 tablet, Rfl: 0 .  FLUoxetine (PROZAC) 40 MG capsule, Take 1 capsule (40 mg total) by mouth daily., Disp: 90 capsule, Rfl: 3   Allergies  Allergen Reactions  . Amoxicillin Rash    Allergy is from childhood; was taken to the hospital by her mother Has patient had a PCN reaction causing immediate rash, facial/tongue/throat swelling, SOB or lightheadedness with hypotension: Unknown Has patient had a PCN reaction causing severe rash involving mucus membranes or skin necrosis: No Has patient had a PCN reaction that required hospitalization: No Has patient had a PCN reaction occurring within the last 10 years: No If all of the above answers are "NO", then may proceed with Cephalos    ROS Review of Systems  Constitutional: Negative.   HENT: Negative.   Eyes: Negative.   Respiratory: Positive for shortness of breath.   Cardiovascular: Positive for palpitations.  Gastrointestinal: Positive for nausea.  Endocrine: Negative.   Genitourinary: Negative.   Musculoskeletal:       Reports left shoulder pain/stiffness  Skin: Negative.   Allergic/Immunologic: Negative.   Neurological: Positive for light-headedness.  Hematological: Negative.   Psychiatric/Behavioral: The patient is nervous/anxious.   All other systems reviewed and are negative.     Objective:    Physical Exam Vitals reviewed.  Constitutional:      Appearance: Normal appearance.  HENT:     Mouth/Throat:     Mouth: Mucous membranes are moist.  Eyes:     Pupils: Pupils are equal, round, and reactive to light.  Neck:     Vascular: No carotid bruit.  Cardiovascular:     Rate and Rhythm: Normal rate and regular rhythm.     Pulses: Normal  pulses.     Heart sounds: Murmur heard.  Systolic murmur is present with a grade of 1/6.      Comments: Mild systolic click Pulmonary:     Effort: Pulmonary effort is normal.     Breath sounds: Normal breath sounds.  Abdominal:     General: Bowel sounds are normal.     Palpations: Abdomen is soft. There is no hepatomegaly, splenomegaly or mass.     Tenderness: There is no abdominal tenderness.     Hernia: No hernia is present.  Musculoskeletal:  General: No tenderness.     Cervical back: Neck supple.     Right lower leg: No edema.     Left lower leg: No edema.  Skin:    Findings: No rash.  Neurological:     Mental Status: She is alert and oriented to person, place, and time.     Motor: No weakness.  Psychiatric:        Mood and Affect: Affect normal. Mood is anxious.        Behavior: Behavior normal.     BP 125/70   Pulse 72   Ht 5\' 5"  (1.651 m)   Wt 144 lb 11.2 oz (65.6 kg)   BMI 24.08 kg/m  Wt Readings from Last 3 Encounters:  04/06/20 144 lb 11.2 oz (65.6 kg)  02/21/20 140 lb (63.5 kg)  01/03/20 139 lb (63 kg)    Health Maintenance Due  Topic Date Due  . Hepatitis C Screening  Never done  . COVID-19 Vaccine (1) Never done  . INFLUENZA VACCINE  04/05/2020    There are no preventive care reminders to display for this patient.  Laboratory Data: I have reviewed this information for accuracy. CBC Latest Ref Rng & Units 04/08/2019 10/17/2018 06/22/2018  WBC 3.4 - 10.8 x10E3/uL 5.3 6.3 12.0(H)  Hemoglobin 11.1 - 15.9 g/dL 16.113.3 09.613.6 11.8(L)  Hematocrit 34.0 - 46.6 % 41.4 41.6 33.7(L)  Platelets 150 - 450 x10E3/uL 272 230 112(L)   CMP Latest Ref Rng & Units 04/08/2019 10/17/2018 06/21/2018  Glucose 65 - 99 mg/dL 85 94 -  BUN 6 - 20 mg/dL 14 15 -  Creatinine 0.450.57 - 1.00 mg/dL 4.090.72 8.110.56 -  Sodium 914134 - 144 mmol/L 139 137 -  Potassium 3.5 - 5.2 mmol/L 4.5 3.4(L) -  Chloride 96 - 106 mmol/L 103 105 -  CO2 20 - 29 mmol/L 21 22 -  Calcium 8.7 - 10.2 mg/dL 78.210.0  9.6 -  Total Protein 6.5 - 8.1 g/dL - 7.7 7.2  Total Bilirubin 0.3 - 1.2 mg/dL - 0.5 0.5  Alkaline Phos 38 - 126 U/L - 44 171(H)  AST 15 - 41 U/L - 18 33  ALT 0 - 44 U/L - 17 30    No results found for: TSH Lab Results  Component Value Date   ALBUMIN 4.7 10/17/2018   ANIONGAP 10 10/17/2018   No results found for: CHOL, HDL, LDLCALC, CHOLHDL No results found for: TRIG No results found for: HGBA1C    Assessment & Plan:   Problem List Items Addressed This Visit      Cardiovascular and Mediastinum   Mitral valve prolapse    Will schedule an echocardiogram for further evaluation.         Other   Uses birth control    Referral to OB-GYN.      Relevant Orders   Ambulatory referral to Obstetrics / Gynecology   Anxiety - Primary    - Patient experiencing high levels of anxiety.  - Encouraged patient to engage in relaxing activities like yoga, meditation, journaling, going for a walk, or participating in a hobby.  - Encouraged patient to reach out to trusted friends or family members about recent struggles       Relevant Medications   ALPRAZolam (XANAX) 1 MG tablet   FLUoxetine (PROZAC) 40 MG capsule   ALPRAZolam (XANAX) 0.5 MG tablet   Vaping-related disorder    - I instructed the patient to stop smoking and provided them with smoking cessation materials.  -  I informed the patient that smoking puts them at increased risk for cancer, COPD, hypertension, and more.  - Informed the patient to seek help if they begin to have trouble breathing, develop chest pain, start to cough up blood, feel faint, or pass out.           @ENCMED @   Follow-up: Return in about 3 months (around 07/07/2020) for echocardiogram.    Dr. 13/10/2019 [facility] [address] [place], [place]   By signing my name below, I, [id number], attest that this documentation has been prepared under the direction and in the presence of Nikki Dom, MD. Electronically  Signed: Corky Downs, MD 04/06/20, 1:03 PM   I personally performed the services described in this documentation, which was SCRIBED in my presence. The recorded information has been reviewed and considered accurate. It has been edited as necessary during review. 06/06/20, MD

## 2020-04-06 NOTE — Assessment & Plan Note (Signed)
-   I instructed the patient to stop smoking and provided them with smoking cessation materials.  - I informed the patient that smoking puts them at increased risk for cancer, COPD, hypertension, and more.  - Informed the patient to seek help if they begin to have trouble breathing, develop chest pain, start to cough up blood, feel faint, or pass out.  

## 2020-04-06 NOTE — Assessment & Plan Note (Signed)
Referral to OB/GYN

## 2020-04-06 NOTE — Assessment & Plan Note (Signed)
-   Patient experiencing high levels of anxiety.  - Encouraged patient to engage in relaxing activities like yoga, meditation, journaling, going for a walk, or participating in a hobby.  - Encouraged patient to reach out to trusted friends or family members about recent struggles 

## 2020-04-06 NOTE — Assessment & Plan Note (Signed)
Will schedule an echocardiogram for further evaluation.

## 2020-04-10 ENCOUNTER — Telehealth: Payer: Self-pay | Admitting: Obstetrics and Gynecology

## 2020-04-10 NOTE — Telephone Encounter (Signed)
Pt was called and told about the incoming referral to our office. The pt stated she wanted to go ahead and make that appt. I asked her did her pcp tell her a provider to see here at our office? The pt stated no, I told her our providers here and she had no preference. The pt was scheduled and she was told to arrive 10 mins before so we can get her information. The pt verbally understood

## 2020-04-29 ENCOUNTER — Encounter: Payer: Medicaid Other | Admitting: Obstetrics and Gynecology

## 2020-05-07 ENCOUNTER — Ambulatory Visit: Payer: Medicaid Other | Admitting: Internal Medicine

## 2020-05-15 ENCOUNTER — Ambulatory Visit: Payer: Medicaid Other | Admitting: Physician Assistant

## 2020-05-20 ENCOUNTER — Other Ambulatory Visit: Payer: Self-pay

## 2020-05-20 DIAGNOSIS — F419 Anxiety disorder, unspecified: Secondary | ICD-10-CM

## 2020-05-21 ENCOUNTER — Other Ambulatory Visit: Payer: Self-pay

## 2020-05-21 ENCOUNTER — Ambulatory Visit (INDEPENDENT_AMBULATORY_CARE_PROVIDER_SITE_OTHER): Payer: Medicaid Other | Admitting: Internal Medicine

## 2020-05-21 DIAGNOSIS — Z1152 Encounter for screening for COVID-19: Secondary | ICD-10-CM | POA: Diagnosis not present

## 2020-05-21 LAB — POC COVID19 BINAXNOW: SARS Coronavirus 2 Ag: NEGATIVE

## 2020-05-21 MED ORDER — ALPRAZOLAM 0.5 MG PO TABS: 0.5000 mg | ORAL_TABLET | Freq: Two times a day (BID) | ORAL | 0 refills | Status: DC

## 2020-05-27 ENCOUNTER — Encounter: Payer: Medicaid Other | Admitting: Obstetrics and Gynecology

## 2020-06-02 ENCOUNTER — Encounter: Payer: Self-pay | Admitting: Obstetrics and Gynecology

## 2020-06-08 ENCOUNTER — Ambulatory Visit: Payer: Medicaid Other | Admitting: Internal Medicine

## 2020-06-08 ENCOUNTER — Other Ambulatory Visit: Payer: Self-pay

## 2020-06-18 ENCOUNTER — Encounter: Payer: Self-pay | Admitting: Family Medicine

## 2020-06-18 ENCOUNTER — Ambulatory Visit (INDEPENDENT_AMBULATORY_CARE_PROVIDER_SITE_OTHER): Payer: Medicaid Other | Admitting: Family Medicine

## 2020-06-18 ENCOUNTER — Other Ambulatory Visit: Payer: Self-pay

## 2020-06-18 VITALS — BP 148/99 | HR 93 | Ht 65.0 in | Wt 144.0 lb

## 2020-06-18 DIAGNOSIS — J069 Acute upper respiratory infection, unspecified: Secondary | ICD-10-CM | POA: Diagnosis not present

## 2020-06-18 LAB — POC COVID19 BINAXNOW: SARS Coronavirus 2 Ag: NEGATIVE

## 2020-06-18 NOTE — Progress Notes (Signed)
Established Patient Office Visit  SUBJECTIVE:  Subjective  Patient ID: Karen Huang, female    DOB: May 17, 1996  Age: 24 y.o. MRN: [id number]  CC:  Chief Complaint  Patient presents with   Sore Throat    patient complains of sore throat x 2 days, also having a lot of ear pain when swallowing    Diarrhea    having nausea and cold sweats    Cough    productive cough x 2 days     HPI Karen Huang is a 24 y.o. female presenting today for sore throat x 3 days, no fever, ear pressure reported as well.     Past Medical History:  Diagnosis Date   Allergies    Anxiety    Asthma    Depression    Headache    Hx of chlamydia infection    age 32   SVD (spontaneous vaginal delivery) 06/21/2018    Past Surgical History:  Procedure Laterality Date   TONSILLECTOMY      Family History  Problem Relation Age of Onset   Hypertension Father    Heart Problems Father    Hypertension Sister    Hypertension Paternal Grandmother    COPD Paternal Grandmother    Diabetes Paternal Grandmother    Heart Problems Paternal Grandmother    Alcohol abuse Mother    ADD / ADHD Brother    COPD Maternal Grandmother     Social History   Socioeconomic History   Marital status: Single    Spouse name: Not on file   Number of children: Not on file   Years of education: Not on file   Highest education level: Not on file  Occupational History   Not on file  Tobacco Use   Smoking status: Current Some Day Smoker    Types: E-cigarettes    Last attempt to quit: 11/17/2017    Years since quitting: 2.5   Smokeless tobacco: Never Used  Vaping Use   Vaping Use: Some days   Last attempt to quit: 11/03/2017  Substance and Sexual Activity   Alcohol use: No   Drug use: No   Sexual activity: Yes    Birth control/protection: None  Other Topics Concern   Not on file  Social History Narrative   Not on file   Social Determinants of Health   Financial  Resource Strain:    Difficulty of Paying Living Expenses: Not on file  Food Insecurity:    Worried About Programme researcher, broadcasting/film/video in the Last Year: Not on file   [employer] of Food in the Last Year: Not on file  Transportation Needs:    Lack of Transportation (Medical): Not on file   Lack of Transportation (Non-Medical): Not on file  Physical Activity:    Days of Exercise per Week: Not on file   Minutes of Exercise per Session: Not on file  Stress:    Feeling of Stress : Not on file  Social Connections:    Frequency of Communication with Friends and Family: Not on file   Frequency of Social Gatherings with Friends and Family: Not on file   Attends Religious Services: Not on file   Active Member of Clubs or Organizations: Not on file   Attends Banker Meetings: Not on file   Marital Status: Not on file  Intimate Partner Violence:    Fear of Current or Ex-Partner: Not on file   Emotionally Abused: Not on file   Physically Abused: Not  on file   Sexually Abused: Not on file     Current Outpatient Medications:    ALPRAZolam (XANAX) 0.5 MG tablet, Take 1 tablet (0.5 mg total) by mouth 2 (two) times daily., Disp: 60 tablet, Rfl: 0   FLUoxetine (PROZAC) 40 MG capsule, Take 1 capsule (40 mg total) by mouth daily., Disp: 90 capsule, Rfl: 3   hydrOXYzine (ATARAX/VISTARIL) 10 MG tablet, Take 1 tablet (10 mg total) by mouth 3 (three) times daily as needed., Disp: 30 tablet, Rfl: 1   ALPRAZolam (XANAX) 1 MG tablet, Take 1 mg by mouth as needed for anxiety., Disp: , Rfl:    Allergies  Allergen Reactions   Amoxicillin Rash    Allergy is from childhood; was taken to the hospital by her mother Has patient had a PCN reaction causing immediate rash, facial/tongue/throat swelling, SOB or lightheadedness with hypotension: Unknown Has patient had a PCN reaction causing severe rash involving mucus membranes or skin necrosis: No Has patient had a PCN reaction that required  hospitalization: No Has patient had a PCN reaction occurring within the last 10 years: No If all of the above answers are "NO", then may proceed with Cephalos    ROS Review of Systems  HENT: Positive for congestion, ear pain and sore throat.   Respiratory: Positive for cough.   Gastrointestinal: Positive for diarrhea.     OBJECTIVE:    Physical Exam Vitals reviewed.  Constitutional:      Appearance: Normal appearance.  HENT:     Right Ear: Tympanic membrane normal.     Left Ear: Tympanic membrane normal.     Mouth/Throat:     Mouth: Mucous membranes are moist.     Pharynx: Posterior oropharyngeal erythema present. No oropharyngeal exudate.     Comments: Tonsillectomy as a child Eyes:     Pupils: Pupils are equal, round, and reactive to light.  Neck:     Vascular: No carotid bruit.  Cardiovascular:     Rate and Rhythm: Normal rate and regular rhythm.     Pulses: Normal pulses.     Heart sounds: Normal heart sounds.  Pulmonary:     Effort: Pulmonary effort is normal.     Breath sounds: Normal breath sounds.  Abdominal:     General: Bowel sounds are normal.     Palpations: Abdomen is soft. There is no hepatomegaly, splenomegaly or mass.     Tenderness: There is no abdominal tenderness.     Hernia: No hernia is present.  Musculoskeletal:        General: No tenderness.     Cervical back: Neck supple.     Right lower leg: No edema.     Left lower leg: No edema.  Skin:    Findings: No rash.  Neurological:     Mental Status: She is alert and oriented to person, place, and time.     Motor: No weakness.  Psychiatric:        Mood and Affect: Mood and affect normal.        Behavior: Behavior normal.     BP (!) 148/99    Pulse 93    Ht 5\' 5"  (1.651 m)    Wt 144 lb (65.3 kg)    BMI 23.96 kg/m  Wt Readings from Last 3 Encounters:  06/18/20 144 lb (65.3 kg)  04/06/20 144 lb 11.2 oz (65.6 kg)  02/21/20 140 lb (63.5 kg)    Health Maintenance Due  Topic Date Due    Hepatitis  C Screening  Never done   COVID-19 Vaccine (1) Never done   INFLUENZA VACCINE  Never done   CHLAMYDIA SCREENING  05/16/2020    There are no preventive care reminders to display for this patient.  CBC Latest Ref Rng & Units 04/08/2019 10/17/2018 06/22/2018  WBC 3.4 - 10.8 x10E3/uL 5.3 6.3 12.0(H)  Hemoglobin 11.1 - 15.9 g/dL 65.0 35.4 11.8(L)  Hematocrit 34.0 - 46.6 % 41.4 41.6 33.7(L)  Platelets 150 - 450 x10E3/uL 272 230 112(L)   CMP Latest Ref Rng & Units 04/08/2019 10/17/2018 06/21/2018  Glucose 65 - 99 mg/dL 85 94 -  BUN 6 - 20 mg/dL 14 15 -  Creatinine 6.56 - 1.00 mg/dL 8.12 7.51 -  Sodium 700 - 144 mmol/L 139 137 -  Potassium 3.5 - 5.2 mmol/L 4.5 3.4(L) -  Chloride 96 - 106 mmol/L 103 105 -  CO2 20 - 29 mmol/L 21 22 -  Calcium 8.7 - 10.2 mg/dL 17.4 9.6 -  Total Protein 6.5 - 8.1 g/dL - 7.7 7.2  Total Bilirubin 0.3 - 1.2 mg/dL - 0.5 0.5  Alkaline Phos 38 - 126 U/L - 44 171(H)  AST 15 - 41 U/L - 18 33  ALT 0 - 44 U/L - 17 30    No results found for: TSH Lab Results  Component Value Date   ALBUMIN 4.7 10/17/2018   ANIONGAP 10 10/17/2018   No results found for: CHOL, HDL, LDLCALC, CHOLHDL No results found for: TRIG No results found for: HGBA1C    ASSESSMENT & PLAN:   Problem List Items Addressed This Visit      Respiratory   Viral upper respiratory tract infection - Primary    Pt with 2 days hx of sore throat and ear pressure with intermittent cough. Covid negative today. Plan- Allergy medication OTC of pt choice, 2 days work note, Return with Fever or worsening sx. Held abx for now no signs of bacterial infection.       Relevant Orders   POC COVID-19 (Completed)      No orders of the defined types were placed in this encounter.     Follow-up: No follow-ups on file.    Irish Lack, FNP [facility] [address], [place], [place] [address]

## 2020-06-18 NOTE — Assessment & Plan Note (Signed)
Pt with 2 days hx of sore throat and ear pressure with intermittent cough. Covid negative today. Plan- Allergy medication OTC of pt choice, 2 days work note, Return with Fever or worsening sx. Held abx for now no signs of bacterial infection.

## 2020-06-22 ENCOUNTER — Other Ambulatory Visit: Payer: Medicaid Other

## 2020-06-23 ENCOUNTER — Ambulatory Visit
Admission: EM | Admit: 2020-06-23 | Discharge: 2020-06-23 | Disposition: A | Discharge status: Home / Self Care | Payer: Medicaid Other | Attending: Emergency Medicine | Admitting: Emergency Medicine

## 2020-06-23 DIAGNOSIS — Z20822 Contact with and (suspected) exposure to covid-19: Secondary | ICD-10-CM

## 2020-06-23 DIAGNOSIS — J0110 Acute frontal sinusitis, unspecified: Secondary | ICD-10-CM | POA: Diagnosis not present

## 2020-06-23 DIAGNOSIS — R059 Cough, unspecified: Secondary | ICD-10-CM | POA: Diagnosis not present

## 2020-06-23 DIAGNOSIS — J029 Acute pharyngitis, unspecified: Secondary | ICD-10-CM

## 2020-06-23 LAB — POCT RAPID STREP A (OFFICE): Rapid Strep A Screen: NEGATIVE

## 2020-06-23 MED ORDER — DOXYCYCLINE HYCLATE 100 MG PO CAPS
100.0000 mg | ORAL_CAPSULE | Freq: Two times a day (BID) | ORAL | 0 refills | Status: AC
Start: 2020-06-23 — End: 2020-06-30

## 2020-06-23 MED ORDER — PSEUDOEPHEDRINE HCL 30 MG PO TABS
60.0000 mg | ORAL_TABLET | Freq: Three times a day (TID) | ORAL | 0 refills | Status: DC | PRN
Start: 2020-06-23 — End: 2021-04-29

## 2020-06-23 NOTE — ED Triage Notes (Signed)
Pt c/o diarrhea, bilateral ear pressure/itchy, cough, sore throat, sinus headache, and nasal congestion x5 days. States had a neg covid test on Friday. Pt requesting to be tested for strep and flu.

## 2020-06-23 NOTE — ED Provider Notes (Signed)
[facility]    CSN: [id number] Arrival date & time: 06/23/20  1109      History   Chief Complaint Chief Complaint  Patient presents with   Sore Throat    HPI Karen Huang is a 24 y.o. female.   24 year old female presents with nasal congestion, sore throat, bilateral ear pressure and cough for the past 5 to 6 days. Now getting worse with sinus headaches, more sinus pressure and facial pain. Had nausea and vomiting the first day but now having mild diarrhea. Was seen at [facility] on 10/14 and had a negative COVID 19 test and was dx with a URI. Symptoms now worse. She has taken Tylenol, Ibuprofen, Theraflu, Mucinex and green tea with no relief. Does smoke tobacco daily. No known exposure to COVID 19. Has not been vaccinated. History of anxiety, depression and asthma and currently on generic Prozac daily.   The history is provided by the patient.    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   Viral upper respiratory tract infection 02/21/2020   Wheezing 02/21/2020   Hepatitis C antibody test positive 11/28/2018   PIH (pregnancy induced hypertension), third trimester 06/21/2018   SVD (spontaneous vaginal delivery) 06/21/2018   Postpartum care following vaginal delivery 06/21/2018    Past Surgical History:  Procedure Laterality Date   TONSILLECTOMY      OB History    Gravida  1   Para  1   Term  1   Preterm  0   AB  0   Living  1     SAB  0   TAB  0   Ectopic  0   Multiple  0   Live Births  1            Home Medications    Prior to Admission medications   Medication Sig Start Date  End Date Taking? Authorizing Provider  ALPRAZolam Prudy Feeler) 0.5 MG tablet Take 1 tablet (0.5 mg total) by mouth 2 (two) times daily. 05/21/20   Corky Downs, MD  doxycycline (VIBRAMYCIN) 100 MG capsule Take 1 capsule (100 mg total) by mouth 2 (two) times daily for 7 days. 06/23/20 06/30/20  Sudie Grumbling, NP  FLUoxetine (PROZAC) 40 MG capsule Take 1 capsule (40 mg total) by mouth daily. 04/06/20   Corky Downs, MD  pseudoephedrine (SUDAFED) 30 MG tablet Take 2 tablets (60 mg total) by mouth every 8 (eight) hours as needed for congestion. 06/23/20   AmyotAli Lowe, NP    Family History Family History  Problem Relation Age of Onset   Hypertension Father    Heart Problems Father    Hypertension Sister    Hypertension Paternal Grandmother    COPD Paternal Grandmother    Diabetes Paternal Grandmother    Heart Problems Paternal Grandmother    Alcohol abuse Mother    ADD / ADHD Brother    COPD Maternal Grandmother     Social History Social History   Tobacco Use   Smoking status: Current Some Day Smoker    Types: E-cigarettes  Last attempt to quit: 11/17/2017    Years since quitting: 2.6   Smokeless tobacco: Never Used  Vaping Use   Vaping Use: Some days   Last attempt to quit: 11/03/2017  Substance Use Topics   Alcohol use: No   Drug use: No     Allergies   Amoxicillin   Review of Systems Review of Systems  Constitutional: Positive for activity change, appetite change, chills and fatigue. Negative for fever.  HENT: Positive for congestion, ear pain (bilateral ear pressure), postnasal drip, sinus pressure, sinus pain and sore throat. Negative for ear discharge, facial swelling, mouth sores, nosebleeds and trouble swallowing.   Eyes: Negative for pain, discharge, redness and itching.  Respiratory: Positive for cough. Negative for shortness of breath and wheezing.   Gastrointestinal: Positive for diarrhea, nausea and vomiting.  Musculoskeletal: Positive for  arthralgias and myalgias. Negative for neck pain and neck stiffness.  Skin: Negative for color change and rash.  Allergic/Immunologic: Negative for food allergies and immunocompromised state.  Neurological: Positive for headaches. Negative for dizziness, tremors, seizures, syncope and light-headedness.  Hematological: Negative for adenopathy. Does not bruise/bleed easily.     Physical Exam Triage Vital Signs ED Triage Vitals [06/23/20 1212]  Enc Vitals Group     BP 129/81     Pulse Rate 83     Resp 18     Temp 98.1 F (36.7 C)     Temp Source Oral     SpO2 96 %     Weight      Height      Head Circumference      Peak Flow      Pain Score 4     Pain Loc      Pain Edu?      Excl. in GC?    No data found.  Updated Vital Signs BP 129/81 (BP Location: Left Arm)    Pulse 83    Temp 98.1 F (36.7 C) (Oral)    Resp 18    SpO2 96%    Breastfeeding No   Visual Acuity Right Eye Distance:   Left Eye Distance:   Bilateral Distance:    Right Eye Near:   Left Eye Near:    Bilateral Near:     Physical Exam Vitals and nursing note reviewed.  Constitutional:      General: She is awake. She is not in acute distress.    Appearance: She is well-developed and well-groomed. She is ill-appearing.     Comments: She is sitting on the exam table in no acute distress but appears tired and ill.   HENT:     Head: Normocephalic and atraumatic.     Right Ear: Hearing, ear canal and external ear normal. No drainage. There is no impacted cerumen. Tympanic membrane is bulging. Tympanic membrane is not injected, perforated or erythematous.     Left Ear: Hearing, ear canal and external ear normal. No drainage. There is no impacted cerumen. Tympanic membrane is bulging. Tympanic membrane is not injected, perforated or erythematous.     Nose: Congestion present.     Right Sinus: Maxillary sinus tenderness and frontal sinus tenderness present.     Left Sinus: Maxillary sinus tenderness and frontal  sinus tenderness present.     Mouth/Throat:     Lips: Pink.     Mouth: Mucous membranes are moist.     Pharynx: Uvula midline. Posterior oropharyngeal erythema present. No pharyngeal swelling, oropharyngeal exudate or uvula swelling.  Eyes:  Extraocular Movements: Extraocular movements intact.     Conjunctiva/sclera: Conjunctivae normal.  Cardiovascular:     Rate and Rhythm: Normal rate and regular rhythm.     Heart sounds: Normal heart sounds. No murmur heard.   Pulmonary:     Effort: Pulmonary effort is normal. No respiratory distress.     Breath sounds: Normal breath sounds and air entry. No decreased air movement. No decreased breath sounds, wheezing, rhonchi or rales.  Musculoskeletal:     Cervical back: Normal range of motion and neck supple. No rigidity.  Lymphadenopathy:     Cervical: No cervical adenopathy.  Skin:    General: Skin is warm and dry.     Capillary Refill: Capillary refill takes less than 2 seconds.     Findings: No rash.  Neurological:     General: No focal deficit present.     Mental Status: She is alert and oriented to person, place, and time.  Psychiatric:        Mood and Affect: Mood normal.        Behavior: Behavior normal. Behavior is cooperative.        Thought Content: Thought content normal.        Judgment: Judgment normal.      UC Treatments / Results  Labs (all labs ordered are listed, but only abnormal results are displayed) Labs Reviewed  NOVEL CORONAVIRUS, NAA  POCT RAPID STREP A (OFFICE)    EKG   Radiology No results found.  Procedures Procedures (including critical care time)  Medications Ordered in UC Medications - No data to display  Initial Impression / Assessment and Plan / UC Course  I have reviewed the triage vital signs and the nursing notes.  Pertinent labs & imaging results that were available during my care of the patient were reviewed by me and considered in my medical decision making (see chart for  details).    Reviewed with patient that she probably has a frontal sinus infection- may be viral and recommend retesting for COVID 19. Due to worsening of symptoms, will treat for possible bacterial infection. Start Doxycycline 100mg  twice a day as directed. May take Sudafed 60mg  every 8 hours as needed for congestion. Continue to push fluids to help loosen up mucus in sinuses. May take Tylenol 1000mg  every 8 hours as needed for pain/headache. Rest. Stay at home. Note written for work. Follow-up pending COVID 19 test results.  Final Clinical Impressions(s) / UC Diagnoses   Final diagnoses:  Acute non-recurrent frontal sinusitis  Cough  Acute sore throat  Encounter for screening laboratory testing for COVID-19 virus     Discharge Instructions     Recommend start Doxycycline 100mg  twice a day as directed. May take Sudafed 60mg  every 8 hours as needed for congestion. May continue Tylenol 1000mg  every 8 hours as needed for pain. Continue to push fluids to help loosen up mucus in sinuses. Follow-up pending COVID 19 test results.     ED Prescriptions    Medication Sig Dispense Auth. Provider   doxycycline (VIBRAMYCIN) 100 MG capsule Take 1 capsule (100 mg total) by mouth 2 (two) times daily for 7 days. 14 capsule , NP   pseudoephedrine (SUDAFED) 30 MG tablet Take 2 tablets (60 mg total) by mouth every 8 (eight) hours as needed for congestion. 30 tablet Violett Hobbs, , NP     PDMP not reviewed this encounter.   , NP 06/24/20 

## 2020-06-23 NOTE — Discharge Instructions (Addendum)
Recommend start Doxycycline 100mg  twice a day as directed. May take Sudafed 60mg  every 8 hours as needed for congestion. May continue Tylenol 1000mg  every 8 hours as needed for pain. Continue to push fluids to help loosen up mucus in sinuses. Follow-up pending COVID 19 test results.

## 2020-06-24 LAB — SARS-COV-2, NAA 2 DAY TAT

## 2020-06-24 LAB — NOVEL CORONAVIRUS, NAA: SARS-CoV-2, NAA: NOT DETECTED

## 2020-07-10 ENCOUNTER — Ambulatory Visit: Payer: Medicaid Other | Admitting: Psychologist

## 2020-07-23 ENCOUNTER — Ambulatory Visit: Payer: Medicaid Other | Admitting: Psychologist

## 2020-11-24 ENCOUNTER — Ambulatory Visit: Payer: Medicaid Other | Admitting: Internal Medicine

## 2020-12-06 DIAGNOSIS — R002 Palpitations: Secondary | ICD-10-CM | POA: Diagnosis not present

## 2021-02-15 ENCOUNTER — Other Ambulatory Visit: Payer: Self-pay

## 2021-02-15 ENCOUNTER — Ambulatory Visit (INDEPENDENT_AMBULATORY_CARE_PROVIDER_SITE_OTHER): Payer: Medicaid Other | Admitting: Internal Medicine

## 2021-02-15 ENCOUNTER — Encounter: Payer: Self-pay | Admitting: Internal Medicine

## 2021-02-15 VITALS — BP 107/72 | HR 74 | Ht 65.5 in | Wt 142.7 lb

## 2021-02-15 DIAGNOSIS — J069 Acute upper respiratory infection, unspecified: Secondary | ICD-10-CM | POA: Diagnosis not present

## 2021-02-15 DIAGNOSIS — F419 Anxiety disorder, unspecified: Secondary | ICD-10-CM | POA: Diagnosis not present

## 2021-02-15 DIAGNOSIS — U070 Vaping-related disorder: Secondary | ICD-10-CM | POA: Diagnosis not present

## 2021-02-15 DIAGNOSIS — J4520 Mild intermittent asthma, uncomplicated: Secondary | ICD-10-CM

## 2021-02-15 DIAGNOSIS — Z124 Encounter for screening for malignant neoplasm of cervix: Secondary | ICD-10-CM

## 2021-02-15 DIAGNOSIS — R768 Other specified abnormal immunological findings in serum: Secondary | ICD-10-CM

## 2021-02-15 DIAGNOSIS — Z0000 Encounter for general adult medical examination without abnormal findings: Secondary | ICD-10-CM | POA: Diagnosis not present

## 2021-02-15 LAB — POC COVID19 BINAXNOW: SARS Coronavirus 2 Ag: NEGATIVE

## 2021-02-15 NOTE — Assessment & Plan Note (Signed)
-   Patient experiencing high levels of anxiety.  - Encouraged patient to engage in relaxing activities like yoga, meditation, journaling, going for a walk, or participating in a hobby.  - Encouraged patient to reach out to trusted friends or family members about recent struggles, patient was referred to psychiatrist

## 2021-02-15 NOTE — Progress Notes (Signed)
Established Patient Office Visit  Subjective:  Patient ID: Karen Huang, female    DOB: [birth year]  Age: 25 y.o. MRN: [id number]  CC:  Chief Complaint  Patient presents with   Annual Exam    HPI  Karen Huang presents for cough, h/o asthma , anxiety , panic disorder, pt VAP , , don't  drink alcohol, pt has antibody  for hep c Past Medical History:  Diagnosis Date   Allergies    Anxiety    Asthma    Depression    Headache    Hx of chlamydia infection    age 36   SVD (spontaneous vaginal delivery) 06/21/2018    Past Surgical History:  Procedure Laterality Date   TONSILLECTOMY      Family History  Problem Relation Age of Onset   Hypertension Father    Heart Problems Father    Hypertension Sister    Hypertension Paternal Grandmother    COPD Paternal Grandmother    Diabetes Paternal Grandmother    Heart Problems Paternal Grandmother    Alcohol abuse Mother    ADD / ADHD Brother    COPD Maternal Grandmother     Social History   Socioeconomic History   Marital status: Single    Spouse name: Not on file   Number of children: Not on file   Years of education: Not on file   Highest education level: Not on file  Occupational History   Not on file  Tobacco Use   Smoking status: Some Days    Pack years: 0.00    Types: E-cigarettes    Last attempt to quit: 11/17/2017    Years since quitting: 3.2   Smokeless tobacco: Never  Vaping Use   Vaping Use: Some days   Last attempt to quit: 11/03/2017  Substance and Sexual Activity   Alcohol use: No   Drug use: No   Sexual activity: Yes    Birth control/protection: Injection  Other Topics Concern   Not on file  Social History Narrative   Not on file   Social Determinants of Health   Financial Resource Strain: Not on file  Food Insecurity: Not on file  Transportation Needs: Not on file  Physical Activity: Not on file  Stress: Not on file  Social Connections: Not on file  Intimate Partner Violence:  Not on file     Current Outpatient Medications:    ALPRAZolam (XANAX) 0.5 MG tablet, Take 1 tablet (0.5 mg total) by mouth 2 (two) times daily., Disp: 60 tablet, Rfl: 0   FLUoxetine (PROZAC) 40 MG capsule, Take 1 capsule (40 mg total) by mouth daily., Disp: 90 capsule, Rfl: 3   pseudoephedrine (SUDAFED) 30 MG tablet, Take 2 tablets (60 mg total) by mouth every 8 (eight) hours as needed for congestion., Disp: 30 tablet, Rfl: 0   Allergies  Allergen Reactions   Amoxicillin Rash    Allergy is from childhood; was taken to the hospital by her mother Has patient had a PCN reaction causing immediate rash, facial/tongue/throat swelling, SOB or lightheadedness with hypotension: Unknown Has patient had a PCN reaction causing severe rash involving mucus membranes or skin necrosis: No Has patient had a PCN reaction that required hospitalization: No Has patient had a PCN reaction occurring within the last 10 years: No If all of the above answers are "NO", then may proceed with Cephalos    ROS Review of Systems  Constitutional: Negative.   HENT:  Positive for congestion, sinus pain and sneezing.  Negative for nosebleeds and postnasal drip.   Eyes: Negative.   Respiratory:  Positive for cough.   Cardiovascular: Negative.   Gastrointestinal: Negative.   Endocrine: Negative.   Genitourinary: Negative.   Musculoskeletal: Negative.   Skin: Negative.   Allergic/Immunologic: Negative.   Neurological: Negative.   Hematological: Negative.   Psychiatric/Behavioral: Negative.  Negative for behavioral problems.   All other systems reviewed and are negative.    Objective:    Physical Exam Vitals reviewed.  Constitutional:      Appearance: Normal appearance.  HENT:     Right Ear: Tympanic membrane, ear canal and external ear normal. There is no impacted cerumen.     Left Ear: Tympanic membrane, ear canal and external ear normal.     Nose: Rhinorrhea present. No congestion.     Mouth/Throat:      Mouth: Mucous membranes are moist.     Pharynx: No oropharyngeal exudate or posterior oropharyngeal erythema.  Eyes:     Pupils: Pupils are equal, round, and reactive to light.  Neck:     Vascular: No carotid bruit.  Cardiovascular:     Rate and Rhythm: Normal rate and regular rhythm.     Pulses: Normal pulses.     Heart sounds: Normal heart sounds.  Pulmonary:     Effort: Pulmonary effort is normal.     Breath sounds: Normal breath sounds.  Abdominal:     General: Bowel sounds are normal.     Palpations: Abdomen is soft. There is no hepatomegaly, splenomegaly or mass.     Tenderness: There is no abdominal tenderness.     Hernia: No hernia is present.  Musculoskeletal:        General: No tenderness. Normal range of motion.     Cervical back: Neck supple.     Right lower leg: No edema.     Left lower leg: No edema.  Skin:    Coloration: Skin is not jaundiced.     Findings: No bruising or rash.  Neurological:     General: No focal deficit present.     Mental Status: She is alert and oriented to person, place, and time. Mental status is at baseline.     Motor: No weakness.  Psychiatric:        Mood and Affect: Mood and affect normal.        Behavior: Behavior normal.    BP 107/72   Pulse 74   Ht 5' 5.5" (1.664 m)   Wt 142 lb 11.2 oz (64.7 kg)   BMI 23.39 kg/m  Wt Readings from Last 3 Encounters:  02/15/21 142 lb 11.2 oz (64.7 kg)  06/18/20 144 lb (65.3 kg)  04/06/20 144 lb 11.2 oz (65.6 kg)     Health Maintenance Due  Topic Date Due   COVID-19 Vaccine (1) Never done   Pneumococcal Vaccine 58-79 Years old (1 - PCV) Never done   HPV VACCINES (1 - 2-dose series) Never done   Hepatitis C Screening  Never done   CHLAMYDIA SCREENING  05/16/2020       Topic Date Due   HPV VACCINES (1 - 2-dose series) Never done    No results found for: TSH Lab Results  Component Value Date   WBC 5.3 04/08/2019   HGB 13.3 04/08/2019   HCT 41.4 04/08/2019   MCV 90 04/08/2019    PLT 272 04/08/2019   Lab Results  Component Value Date   NA 139 04/08/2019   K 4.5 04/08/2019  CO2 21 04/08/2019   GLUCOSE 85 04/08/2019   BUN 14 04/08/2019   CREATININE 0.72 04/08/2019   BILITOT 0.5 10/17/2018   ALKPHOS 44 10/17/2018   AST 18 10/17/2018   ALT 17 10/17/2018   PROT 7.7 10/17/2018   ALBUMIN 4.7 10/17/2018   CALCIUM 10.0 04/08/2019   ANIONGAP 10 10/17/2018   No results found for: CHOL No results found for: HDL No results found for: LDLCALC No results found for: TRIG No results found for: CHOLHDL No results found for: FVCB4W    Assessment & Plan:   Problem List Items Addressed This Visit       Respiratory   Viral upper respiratory tract infection - Primary    Suggest to use Claritin 5 mg daily #2 stop vaping       Relevant Orders   POC COVID-19 (Completed)   Mild intermittent asthma without complication    Patient was not found to be wheezing, I advised stable         Other   Hepatitis C antibody test positive    We will do a CBC and metabolic panel, she has a hepatitis C antibody and the titer have decreased (       Anxiety    - Patient experiencing high levels of anxiety.  - Encouraged patient to engage in relaxing activities like yoga, meditation, journaling, going for a walk, or participating in a hobby.  - Encouraged patient to reach out to trusted friends or family members about recent struggles, patient was referred to psychiatrist       Vaping-related disorder    Patient was advised to stop vaping.  She is not using any other hard-core drug.       Encounter for preventive care    Physical examination unremarkable except for viral upper respiratory infection for which no treatment was given.  Her ear canals are okay throat was not congested there is no lymphadenopathy chest is clear there is no pedal edema no calf tenderness abdomen is soft nontender, patient will be referred to OB/GYN for the Pap smear      Patient was advised  to buy a book named (feeling good by Dr. Dorette Grate)  No orders of the defined types were placed in this encounter.   Follow-up: No follow-ups on file.    Corky Downs, MD

## 2021-02-15 NOTE — Assessment & Plan Note (Signed)
Physical examination unremarkable except for viral upper respiratory infection for which no treatment was given.  Her ear canals are okay throat was not congested there is no lymphadenopathy chest is clear there is no pedal edema no calf tenderness abdomen is soft nontender, patient will be referred to OB/GYN for the Pap smear

## 2021-02-15 NOTE — Assessment & Plan Note (Signed)
Patient was not found to be wheezing, I advised stable

## 2021-02-15 NOTE — Assessment & Plan Note (Signed)
Suggest to use Claritin 5 mg daily #2 stop vaping

## 2021-02-15 NOTE — Assessment & Plan Note (Signed)
Patient was advised to stop vaping.  She is not using any other hard-core drug.

## 2021-02-15 NOTE — Assessment & Plan Note (Signed)
We will do a CBC and metabolic panel, she has a hepatitis C antibody and the titer have decreased (

## 2021-02-23 NOTE — Addendum Note (Signed)
Addended by: Jobie Quaker on: 02/23/2021 02:33 PM   Modules accepted: Orders

## 2021-03-10 ENCOUNTER — Encounter: Payer: Medicaid Other | Admitting: Obstetrics and Gynecology

## 2021-03-19 ENCOUNTER — Encounter: Payer: Self-pay | Admitting: Obstetrics and Gynecology

## 2021-04-02 ENCOUNTER — Encounter: Payer: Self-pay | Admitting: Obstetrics and Gynecology

## 2021-04-02 ENCOUNTER — Encounter: Payer: Medicaid Other | Admitting: Obstetrics and Gynecology

## 2021-04-18 ENCOUNTER — Other Ambulatory Visit: Payer: Self-pay | Admitting: Internal Medicine

## 2021-04-18 DIAGNOSIS — F419 Anxiety disorder, unspecified: Secondary | ICD-10-CM

## 2021-04-29 ENCOUNTER — Other Ambulatory Visit (HOSPITAL_COMMUNITY)
Admission: RE | Admit: 2021-04-29 | Discharge: 2021-04-29 | Disposition: A | Discharge status: Home / Self Care | Payer: Medicaid Other | Source: Ambulatory Visit | Attending: Obstetrics and Gynecology | Admitting: Obstetrics and Gynecology

## 2021-04-29 ENCOUNTER — Ambulatory Visit (INDEPENDENT_AMBULATORY_CARE_PROVIDER_SITE_OTHER): Payer: Medicaid Other | Admitting: Obstetrics and Gynecology

## 2021-04-29 ENCOUNTER — Other Ambulatory Visit: Payer: Self-pay

## 2021-04-29 ENCOUNTER — Encounter: Payer: Self-pay | Admitting: Obstetrics and Gynecology

## 2021-04-29 VITALS — BP 116/71 | HR 75 | Ht 65.0 in | Wt 143.0 lb

## 2021-04-29 DIAGNOSIS — R102 Pelvic and perineal pain: Secondary | ICD-10-CM | POA: Diagnosis not present

## 2021-04-29 LAB — OB RESULTS CONSOLE GC/CHLAMYDIA: Chlamydia: POSITIVE

## 2021-04-29 NOTE — Progress Notes (Signed)
25 yo P1 with LMP 04/01/21 and BMI 23 presenting today for the evaluation of lower pelvic pain. Patient reports that the pain has been present since the birth of her daughter 3 years ago and has gotten progressively worst. She states that the pain is intermittent and sharp. She first notices it when getting out of bed as a pulling sensation followed by sharp pain. The pain does not radiate anywhere. Her pain is self limited after a few minutes only to return again later in the day. The pain seems to be provoked by intense physical activity or lifting. Pain is also triggered with intercourse. Tylenol and ibuprofen do not alleviate the pain. Patient also reports some dysuria. She reports a monthly period lasting 2-5 days. She is sexually active without contraception and is discussing conception with her fiance. Patient denies any relationship with her pain and her menstrual cycle. Pain is not related to bowel movement. Patient denies any abnormal discharge  Past Medical History:  Diagnosis Date   Allergies    Anxiety    Asthma    Depression    Headache    Hx of chlamydia infection    age 41   SVD (spontaneous vaginal delivery) 06/21/2018   Past Surgical History:  Procedure Laterality Date   TONSILLECTOMY      Family History  Problem Relation Age of Onset   Hypertension Father    Heart Problems Father    Hypertension Sister    Hypertension Paternal Grandmother    COPD Paternal Grandmother    Diabetes Paternal Grandmother    Heart Problems Paternal Grandmother    Alcohol abuse Mother    ADD / ADHD Brother    COPD Maternal Grandmother    Social History   Tobacco Use   Smoking status: Some Days    Types: E-cigarettes    Last attempt to quit: 11/17/2017    Years since quitting: 3.4   Smokeless tobacco: Never  Vaping Use   Vaping Use: Some days   Last attempt to quit: 11/03/2017  Substance Use Topics   Alcohol use: No   Drug use: No   ROS See pertinent in HPI. All other systems  reviewed and non contributory  Blood pressure 116/71, pulse 75, height 5\' 5"  (1.651 m), weight 143 lb (64.9 kg), last menstrual period 04/01/2021. GENERAL: Well-developed, well-nourished female in no acute distress.  ABDOMEN: Soft, nontender, nondistended. No organomegaly. PELVIC: Normal external female genitalia. Vagina is pink and rugated.  Normal discharge. Normal appearing cervix. Uterus is normal in size.  No adnexal mass or tenderness. EXTREMITIES: No cyanosis, clubbing, or edema, 2+ distal pulses.  A/P 25 yo with pelvic pain - Vaginal swab collected to rule out infectious etiology - Urine culture collected - Pelvic ultrasound ordered - Patient will be contacted with abnormal results - If all negative, discussed referral to pelvic physical therapy - patient encouraged to start taking prenatal vitamins

## 2021-04-29 NOTE — BH Specialist Note (Addendum)
Integrated Behavioral Health Initial In-Person Visit  MRN: [id number] Name: Karen Huang  Number of Integrated Behavioral Health Clinician visits:: 1/6 Session Start time: 8:15  Session End time: 9:27 Total time:  72  minutes  Types of Service: Individual psychotherapy and Video visit  Interpretor:No. Interpretor Name and Language: n/a   Warm Hand Off Completed.        Subjective: Karen Huang is a 25 y.o. female accompanied by  n/a Patient was referred by Catalina Antigua, MD for positive depression screening. Patient reports the following symptoms/concerns:Pt states her primary concern today is increase in depression and panic attacks, along with financial stress; open to learning self-coping strategies today and referral to [facility]. Pt denies current SI, no intent and no plan.  Duration of problem: Ongoing; increasing symptoms; Severity of problem:  moderately severe  Objective: Mood: Anxious and Depressed and Affect: Appropriate Risk of harm to self or others: No plan to harm self or others  Life Context: Family and Social: Pt lives with boyfriend and 2yo daughter School/Work: Working fulltime Self-Care: Setting healthy boundaries with family of origin, as self-care Life Changes: Most recent-started new job  Patient and/or Family's Strengths/Protective Factors: Social connections and Parental Resilience  Goals Addressed: Patient will: Reduce symptoms of: anxiety, depression, and stress Increase knowledge and/or ability of: coping skills and stress reduction  Demonstrate ability to: Increase healthy adjustment to current life circumstances  Progress towards Goals: Ongoing  Interventions: Interventions utilized: Mindfulness or Management consultant and Psychoeducation and/or Health Education  Standardized Assessments completed: GAD-7 and PHQ 9  Patient and/or Family Response: Pt agrees with treatment plan  Patient Centered Plan: Patient is on the  following Treatment Plan(s):  IBH  Assessment: Patient currently experiencing Anxiety disorder, unspecified, Major depressive disorder, recurrent, moderate, and Psychosocial stress.   Patient may benefit from psychoeducation and brief therapeutic interventions regarding coping with symptoms of depression, anxiety, life stress .  Plan: Follow up with behavioral health clinician on : Two weeks Behavioral recommendations:  -CALM relaxation breathing exercise twice daily (morning; at bedtime with sleep sounds) -Consider using Worry Time strategy daily, as discussed -Continue taking BH medication as prescribed by PCP -Accept referral to [facility] -Accept referral to [facility] for ongoing [facility] medication management and ongoing therapy Referral(s): Integrated Art gallery manager (In Clinic) and [facility] (LME/Outside Clinic)  Rae Lips, [place]  Depression screen [facility] 2/9 05/03/2021 04/29/2021 02/15/2021 05/17/2019 02/05/2019  Decreased Interest 2 2 0 1 1  Down, Depressed, Hopeless 3 1 0 1 1  PHQ - 2 Score 5 3 0 2 2  Altered sleeping 3 1 - 1 3  Tired, decreased energy 3 1 - 2 2  Change in appetite 1 2 - 1 1  Feeling bad or failure about yourself  2 1 - 1 1  Trouble concentrating 1 2 - 1 1  Moving slowly or fidgety/restless 0 0 - 0 0  Suicidal thoughts 1 0 - 0 0  PHQ-9 Score 16 10 - 8 10  Difficult doing work/chores - - - Not difficult at all Somewhat difficult   GAD 7 : Generalized Anxiety Score 05/03/2021 04/29/2021 01/03/2020  Nervous, Anxious, on Edge 3 3 3   Control/stop worrying 2 3 2   Worry too much - different things 2 3 2   Trouble relaxing 2 2 0  Restless 1 0 1  Easily annoyed or irritable 3 1 1   Afraid - awful might happen 1 1 3   Total GAD  7 Score 14 13 12   Anxiety Difficulty - - Somewhat difficult

## 2021-05-01 LAB — URINE CULTURE: Organism ID, Bacteria: NO GROWTH

## 2021-05-03 ENCOUNTER — Ambulatory Visit (INDEPENDENT_AMBULATORY_CARE_PROVIDER_SITE_OTHER): Payer: Medicaid Other | Admitting: Clinical

## 2021-05-03 ENCOUNTER — Other Ambulatory Visit: Payer: Self-pay

## 2021-05-03 DIAGNOSIS — F331 Major depressive disorder, recurrent, moderate: Secondary | ICD-10-CM

## 2021-05-03 DIAGNOSIS — F419 Anxiety disorder, unspecified: Secondary | ICD-10-CM | POA: Diagnosis not present

## 2021-05-03 DIAGNOSIS — Z658 Other specified problems related to psychosocial circumstances: Secondary | ICD-10-CM

## 2021-05-03 LAB — CERVICOVAGINAL ANCILLARY ONLY
Bacterial Vaginitis (gardnerella): POSITIVE — AB
Candida Glabrata: NEGATIVE
Candida Vaginitis: NEGATIVE
Chlamydia: POSITIVE — AB
Comment: NEGATIVE
Comment: NEGATIVE
Comment: NEGATIVE
Comment: NEGATIVE
Comment: NEGATIVE
Comment: NORMAL
Neisseria Gonorrhea: NEGATIVE
Trichomonas: NEGATIVE

## 2021-05-03 NOTE — Patient Instructions (Signed)
Center for [facility] at [facility] [place],  [address] [phone] (main office) [phone] ([place] office)  Coping with Panic Attacks   What is a panic attack?  You may have had a panic attack if you experienced four or more of the symptoms listed below coming on abruptly and peaking in about 10 minutes.  Panic Symptoms    Pounding heart   Sweating   Trembling or shaking   Shortness of breath   Feeling of choking   Chest pain   Nausea or abdominal distress     Feeling dizzy, unsteady, lightheaded, or faint   Feelings of unreality or being detached from yourself   Fear of losing control or going crazy   Fear of dying   Numbness or tingling   Chills or hot flashes      Panic attacks are sometimes accompanied by avoidance of certain places or situations. These are often situations that would be difficult to escape from or in which help might not be available. Examples might include crowded shopping malls, public transportation, restaurants, or driving.   Why do panic attacks occur?   Panic attacks are the body's alarm system gone awry. All of [place] have a built-in alarm system, powered by adrenaline, which increases our heart rate, breathing, and blood flow in response to danger. Ordinarily, this 'danger response system' works well. In some people, however, the response is either out of proportion to whatever stress is going on, or may come out of the blue without any stress at all.   For example, if you are walking in the woods and see a bear coming your way, a variety of changes occur in your body to prepare you to either fight the danger or flee from the situation. Your heart rate will increase to get more blood flow around your body, your breathing rate will quicken so that more oxygen is available, and your muscles will tighten in order to be ready to fight or run. You may feel nauseated as blood flow leaves your stomach area and  moves into your limbs. These bodily changes are all essential to helping you survive the dangerous situation. After the danger has passed, your body functions will begin to go back to normal. This is because your body also has a system for "recovering" by bringing your body back down to a normal state when the danger is over.   As you can see, the emergency response system is adaptive when there is, in fact, a "true" or "real" danger (e.g., bear). However, sometimes people find that their emergency response system is triggered in "everyday" situations where there really is no true physical danger (e.g., in a meeting, in the grocery store, while driving in normal traffic, etc.).   What triggers a panic attack?  Sometimes particularly stressful situations can trigger a panic attack. For example, an argument with your spouse or stressors at work can cause a stress response (activating the emergency response system) because you perceive it as threatening or overwhelming, even if there is no direct risk to your survival.  Sometimes panic attacks don't seem to be triggered by anything in particular- they may "come out of the blue". Somehow, the natural "fight or flight" emergency response system has gotten activated when there is no real danger. Why does the body go into "emergency mode" when there is no real danger?   Often, people with panic attacks are frightened or alarmed by the physical sensations of  the emergency response system. First, unexpected physical sensations are experienced (tightness in your chest or some shortness of breath). This then leads to feeling fearful or alarmed by these symptoms ("Something's wrong!", "Am I having a heart attack?", "Am I going to faint?") The mind perceives that there is a danger even though no real danger exists. This, in turn, activates the emergency response system ("fight or flight"), leading to a "full blown" panic attack. In summary, panic attacks occur when we  misinterpret physical symptoms as signs of impending death, craziness, loss of control, embarrassment, or fear of fear. Sometimes you may be aware of thoughts of danger that activate the emergency response system (for example, thinking "I'm having a heart attack" when you feel chest pressure or increased heart rate). At other times, however, you may not be aware of such thoughts. After several incidences of being afraid of physical sensations, anxiety and panic can occur in response to the initial sensations without conscious thoughts of danger. Instead, you just feel afraid or alarmed. In other words, the panic or fear may seem to occur "automatically" without you consciously telling yourself anything.   After having had one or more panic attacks, you may also become more focused on what is going on inside your body. You may scan your body and be more vigilant about noticing any symptoms that might signal the start of a panic attack. This makes it easier for panic attacks to happen again because you pick up on sensations you might otherwise not have noticed, and misinterpret them as something dangerous. A panic attack may then result.      How do I cope with panic attacks?  An important part of overcoming panic attacks involves re-interpreting your body's physical reactions and teaching yourself ways to decrease the physical arousal. This can be done through practicing the cognitive and behavioral interventions below.   Research has found that over half of people who have panic attacks show some signs of hyperventilation or overbreathing. This can produce initial sensations that alarm you and lead to a panic attack. Overbreathing can also develop as part of the panic attack and make the symptoms worse. When people hyperventilate, certain blood vessels in the body become narrower. In particular, the brain may get slightly less oxygen. This can lead to the symptoms of dizziness, confusion, and  lightheadedness that often occur during panic attacks. Other parts of the body may also get a bit less oxygen, which may lead to numbness or tingling in the hands or feet or the sensation of cold, clammy hands. It also may lead the heart to pump harder. Although these symptoms may be frightening and feel unpleasant, it is important to remember that hyperventilating is not dangerous. However, you can help overcome the unpleasantness of overbreathing by practicing Breathing Retraining.   Practice this basic technique three times a day, every day:   Inhale. With your shoulders relaxed, inhale as slowly and deeply as you can while you count to six. If you can, use your diaphragm to fill your lungs with air.   Hold. Keep the air in your lungs as you slowly count to four.   Exhale. Slowly breath out as you count to six.   Repeat. Do the inhale-hold-exhale cycle several times. Each time you do it, exhale for longer counts.  Like any new skill, Breathing Retraining requires practice. Try practicing this skill twice a day for several minutes. Initially, do not try this technique in specific situations or when you  become frightened or have a panic attack. Begin by practicing in a quiet environment to build up your skill level so that you can later use it in time of "emergency."   2. Decreasing Avoidance  Regardless of whether you can identify why you began having panic attacks or whether they seemed to come out of the blue, the places where you began having panic attacks often can become triggers themselves. It is not uncommon for individuals to begin to avoid the places where they have had panic attacks. Over time, the individual may begin to avoid more and more places, thereby decreasing their activities and often negatively impacting their quality of life. To break the cycle of avoidance, it is important to first identify the places or situations that are being avoided, and then to do some "relearning."  To  begin this intervention, first create a list of locations or situations that you tend to avoid. Then choose an avoided location or situation that you would like to target first. Now develop an "exposure hierarchy" for this situation or location. An "exposure hierarchy" is a list of actions that make you feel anxious in this situation. Order these actions from least to most anxiety-producing. It is often helpful to have the first item on your hierarchy involve thinking or imagining part of the feared/avoided situation.   Here is an example of an exposure hierarchy for decreasing avoidance of the grocery store. Note how it is ordered from the least amount of anxiety (at the top) to the most anxiety (at the bottom):   Think about going to the grocery store alone.   Go to the grocery store with a friend or family member.   Go to the grocery store alone to pick up a few small items (5-10 minutes in the store).   Shopping for 10-20 minutes in the store alone.   Doing the shopping for the week by myself (20-30 minutes in the store).   Your homework is to "expose" yourself to the lowest item on your hierarchy and use your breathing relaxation and coping statements (see below) to help you remain in the situation. Practice this several times during the upcoming week. Once you have mastered each item with minimal anxiety, move on to the next higher action on your list.   Cognitive Interventions  Identify your negative self-talk Anxious thoughts can increase anxiety symptoms and panic. The first step in changing anxious thinking is to identify your own negative, alarming self-talk. Some common alarming thoughts:  I'm having a heart attack.            I must be going crazy. I think I'm dying. People will think I'm crazy. I'm going to pass our.  Oh no- here it comes.  I can't stand this.  I've got to get out of here!  2. Use positive coping statements Changing or disrupting a pattern of anxious thoughts  by replacing them with more calming or supportive statements can help to divert a panic attack. Some common helpful coping statements:  This is not an emergency.  I don't like feeling this way, but I can accept it.  I can feel like this and still be okay.  This has happened before, and I was okay. I'll be okay this time, too.  I can be anxious and still deal with this situation.   /Emotional Wellbeing Apps and Websites Here are a few free apps meant to help you to help yourself.  To find, try searching on   the internet to see if the app is offered on Apple/Android devices. If your first choice doesn't come up on your device, the good news is that there are many choices! Play around with different apps to see which ones are helpful to you.    Calm This is an app meant to help increase calm feelings. Includes info, strategies, and tools for tracking your feelings.      Calm Harm  This app is meant to help with self-harm. Provides many 5-minute or 15-min coping strategies for doing instead of hurting yourself.       Healthy Minds Health Minds is a problem-solving tool to help deal with emotions and cope with stress you encounter wherever you are.      MindShift This app can help people cope with anxiety. Rather than trying to avoid anxiety, you can make an important shift and face it.      MY3  MY3 features a support system, safety plan and resources with the goal of offering a tool to use in a time of need.       My Life My Voice  This mood journal offers a simple solution for tracking your thoughts, feelings and moods. Animated emoticons can help identify your mood.       Relax Melodies Designed to help with sleep, on this app you can mix sounds and meditations for relaxation.      Smiling Mind Smiling Mind is meditation made easy: it's a simple tool that helps put a smile on your mind.        Stop, Breathe & Think  A friendly, simple guide for people through  meditations for mindfulness and compassion.  Stop, Breathe and Think Kids Enter your current feelings and choose a "mission" to help you cope. Offers videos for certain moods instead of just sound recordings.       Team Orange The goal of this tool is to help teens change how they think, act, and react. This app helps you focus on your own good feelings and experiences.      The Virtual Hope Box The Virtual Hope Box (VHB) contains simple tools to help patients with coping, relaxation, distraction, and positive thinking.     

## 2021-05-03 NOTE — BH Specialist Note (Deleted)
Integrated Behavioral Health via Telemedicine Visit  05/03/2021 Karen Huang [id number]  Number of Integrated Behavioral Health visits: 2 Session Start time: 8:15***  Session End time: 8:45*** Total time: {IBH Total Time:21014050}  Referring Provider: *** Patient/Family location: Home*** [facility] Provider location: well female c All persons participating in visit: Patient *** and Karen Huang ***  Types of Service: {CHL AMB TYPE OF SERVICE:[phone]}  I connected with Karen Huang and/or Karen Huang's {family members:20773} via  Telephone or Video Enabled Telemedicine Application  (Video is Caregility application) and verified that I am speaking with the correct person using two identifiers. Discussed confidentiality: {YES/NO:21197}  I discussed the limitations of telemedicine and the availability of in person appointments.  Discussed there is a possibility of technology failure and discussed alternative modes of communication if that failure occurs.  I discussed that engaging in this telemedicine visit, they consent to the provision of behavioral healthcare and the services will be billed under their insurance.  Patient and/or legal guardian expressed understanding and consented to Telemedicine visit: {YES/NO:21197}  Presenting Concerns: Patient and/or family reports the following symptoms/concerns: *** Duration of problem: ***; Severity of problem: {Mild/Moderate/Severe:20260}  Patient and/or Family's Strengths/Protective Factors: {CHL AMB BH PROTECTIVE [phone]}  Goals Addressed: Patient will:  Reduce symptoms of: {IBH Symptoms:21014056}   Increase knowledge and/or ability of: {IBH Patient Tools:21014057}   Demonstrate ability to: {IBH Goals:21014053}  Progress towards Goals: {CHL AMB BH PROGRESS TOWARDS [phone]}  Interventions: Interventions utilized:  {IBH Interventions:21014054} Standardized Assessments completed: {IBH Screening  Tools:21014051}  Patient and/or Family Response: ***  Assessment: Patient currently experiencing ***.   Patient may benefit from ***.  Plan: Follow up with behavioral health clinician on : *** Behavioral recommendations: *** Referral(s): {IBH Referrals:21014055}  I discussed the assessment and treatment plan with the patient and/or parent/guardian. They were provided an opportunity to ask questions and all were answered. They agreed with the plan and demonstrated an understanding of the instructions.   They were advised to call back or seek an in-person evaluation if the symptoms worsen or if the condition fails to improve as anticipated.  Valetta Close Kindell Strada, LCSW

## 2021-05-04 MED ORDER — METRONIDAZOLE 500 MG PO TABS: 500.0000 mg | ORAL_TABLET | Freq: Two times a day (BID) | ORAL | 0 refills | Status: DC

## 2021-05-04 MED ORDER — AZITHROMYCIN 500 MG PO TABS: 1000.0000 mg | ORAL_TABLET | Freq: Once | ORAL | 1 refills | Status: AC

## 2021-05-04 NOTE — Progress Notes (Signed)
Patient informed of BV and Chlamydia infection and of need to pick up RX for Flagyl and Zithromax. Informed of need to inform partner and need for partner to be treated. Informed of need for both parties to abstain from sex until 7 days after treatment is completed. Patient verbalized understanding.

## 2021-05-04 NOTE — Addendum Note (Signed)
Addended by: Catalina Antigua on: 05/04/2021 09:36 AM   Modules accepted: Orders

## 2021-05-06 ENCOUNTER — Other Ambulatory Visit: Payer: Self-pay

## 2021-05-06 ENCOUNTER — Ambulatory Visit
Admission: RE | Admit: 2021-05-06 | Discharge: 2021-05-06 | Disposition: A | Discharge status: Home / Self Care | Payer: Medicaid Other | Source: Ambulatory Visit | Attending: Obstetrics and Gynecology | Admitting: Obstetrics and Gynecology

## 2021-05-06 DIAGNOSIS — R102 Pelvic and perineal pain: Secondary | ICD-10-CM | POA: Diagnosis not present

## 2021-05-26 ENCOUNTER — Ambulatory Visit (HOSPITAL_COMMUNITY): Payer: Medicaid Other | Admitting: Psychiatry

## 2021-06-07 ENCOUNTER — Other Ambulatory Visit: Payer: Self-pay

## 2021-06-07 ENCOUNTER — Ambulatory Visit (INDEPENDENT_AMBULATORY_CARE_PROVIDER_SITE_OTHER): Payer: Medicaid Other | Admitting: Internal Medicine

## 2021-06-07 ENCOUNTER — Encounter: Payer: Self-pay | Admitting: Internal Medicine

## 2021-06-07 VITALS — BP 129/86 | HR 87 | Ht 65.0 in | Wt 148.0 lb

## 2021-06-07 DIAGNOSIS — I341 Nonrheumatic mitral (valve) prolapse: Secondary | ICD-10-CM | POA: Diagnosis not present

## 2021-06-07 DIAGNOSIS — R768 Other specified abnormal immunological findings in serum: Secondary | ICD-10-CM | POA: Diagnosis not present

## 2021-06-07 DIAGNOSIS — R079 Chest pain, unspecified: Secondary | ICD-10-CM

## 2021-06-07 DIAGNOSIS — K2100 Gastro-esophageal reflux disease with esophagitis, without bleeding: Secondary | ICD-10-CM | POA: Diagnosis not present

## 2021-06-07 DIAGNOSIS — U070 Vaping-related disorder: Secondary | ICD-10-CM

## 2021-06-07 MED ORDER — OMEPRAZOLE 20 MG PO CPDR: 20.0000 mg | DELAYED_RELEASE_CAPSULE | Freq: Every day | ORAL | 3 refills | Status: DC

## 2021-06-07 NOTE — Progress Notes (Addendum)
Established Patient Office Visit  Subjective:  Patient ID: Karen Huang, female    DOB: [birth year]  Age: 25 y.o. MRN: [id number]  CC:  Chief Complaint  Patient presents with   Chest Pain    Patient complains of head and chest pressure. She says she feels achy and has a dull pain in chest with pressure in head x 2 weeks. Patient states symptoms have gotten worse over the 2 week period. She states that it feels like her heart is struggling to beat.     Chest Pain  This is a recurrent problem. The current episode started 1 to 4 weeks ago. The onset quality is gradual. The problem occurs 2 to 4 times per day. The problem has been gradually worsening. The pain is at a severity of 8/10. The pain is moderate. The quality of the pain is described as squeezing. The pain radiates to the precordial region, left jaw and left neck. Associated symptoms include back pain, dizziness, irregular heartbeat and vomiting. Pertinent negatives include no cough, diaphoresis, exertional chest pressure, fever, headaches, sputum production, syncope or weakness.   Karen Huang presents for chest pain  Past Medical History:  Diagnosis Date   Allergies    Anxiety    Asthma    Depression    Headache    Hx of chlamydia infection    age 56   SVD (spontaneous vaginal delivery) 06/21/2018    Past Surgical History:  Procedure Laterality Date   TONSILLECTOMY      Family History  Problem Relation Age of Onset   Hypertension Father    Heart Problems Father    Hypertension Sister    Hypertension Paternal Grandmother    COPD Paternal Grandmother    Diabetes Paternal Grandmother    Heart Problems Paternal Grandmother    Alcohol abuse Mother    ADD / ADHD Brother    COPD Maternal Grandmother     Social History   Socioeconomic History   Marital status: Single    Spouse name: Not on file   Number of children: Not on file   Years of education: Not on file   Highest education level: Not on file   Occupational History   Not on file  Tobacco Use   Smoking status: Some Days    Types: E-cigarettes    Last attempt to quit: 11/17/2017    Years since quitting: 3.5   Smokeless tobacco: Never  Vaping Use   Vaping Use: Some days   Last attempt to quit: 11/03/2017  Substance and Sexual Activity   Alcohol use: No   Drug use: No   Sexual activity: Yes    Birth control/protection: Injection  Other Topics Concern   Not on file  Social History Narrative   Not on file   Social Determinants of Health   Financial Resource Strain: Not on file  Food Insecurity: Food Insecurity Present   Worried About Running Out of Food in the Last Year: Sometimes true   Ran Out of Food in the Last Year: Sometimes true  Transportation Needs: No Transportation Needs   Lack of Transportation (Medical): No   Lack of Transportation (Non-Medical): No  Physical Activity: Not on file  Stress: Not on file  Social Connections: Not on file  Intimate Partner Violence: Not on file     Current Outpatient Medications:    omeprazole (PRILOSEC) 20 MG capsule, Take 1 capsule (20 mg total) by mouth daily., Disp: 30 capsule, Rfl: 3   ALPRAZolam Prudy Feeler)  0.5 MG tablet, Take 1 tablet (0.5 mg total) by mouth 2 (two) times daily. (Patient not taking: Reported on 04/29/2021), Disp: 60 tablet, Rfl: 0   FLUoxetine (PROZAC) 40 MG capsule, TAKE 1 CAPSULE BY MOUTH EVERY DAY, Disp: 90 capsule, Rfl: 3   Allergies  Allergen Reactions   Amoxicillin Rash    Allergy is from childhood; was taken to the hospital by her mother Has patient had a PCN reaction causing immediate rash, facial/tongue/throat swelling, SOB or lightheadedness with hypotension: Unknown Has patient had a PCN reaction causing severe rash involving mucus membranes or skin necrosis: No Has patient had a PCN reaction that required hospitalization: No Has patient had a PCN reaction occurring within the last 10 years: No If all of the above answers are "NO", then may  proceed with Cephalos    ROS Review of Systems  Constitutional:  Negative for diaphoresis and fever.  Respiratory:  Negative for cough and sputum production.   Cardiovascular:  Positive for chest pain. Negative for syncope.  Gastrointestinal:  Positive for vomiting.  Musculoskeletal:  Positive for back pain.  Neurological:  Positive for dizziness. Negative for weakness and headaches.     Objective:    Physical Exam Constitutional:      Appearance: She is well-developed and normal weight.  HENT:     Head: Normocephalic.  Eyes:     Pupils: Pupils are equal, round, and reactive to light.  Cardiovascular:     Rate and Rhythm: Normal rate.     Heart sounds: No murmur heard. Musculoskeletal:     Cervical back: Normal range of motion.  Neurological:     Mental Status: She is alert.    BP 129/86   Pulse 87   Ht 5\' 5"  (1.651 m)   Wt 148 lb (67.1 kg)   BMI 24.63 kg/m  Wt Readings from Last 3 Encounters:  06/07/21 148 lb (67.1 kg)  04/29/21 143 lb (64.9 kg)  02/15/21 142 lb 11.2 oz (64.7 kg)     Health Maintenance Due  Topic Date Due   COVID-19 Vaccine (1) Never done   HPV VACCINES (1 - 2-dose series) Never done   Hepatitis C Screening  Never done   INFLUENZA VACCINE  Never done       Topic Date Due   HPV VACCINES (1 - 2-dose series) Never done    No results found for: TSH Lab Results  Component Value Date   WBC 5.3 04/08/2019   HGB 13.3 04/08/2019   HCT 41.4 04/08/2019   MCV 90 04/08/2019   PLT 272 04/08/2019   Lab Results  Component Value Date   NA 139 04/08/2019   K 4.5 04/08/2019   CO2 21 04/08/2019   GLUCOSE 85 04/08/2019   BUN 14 04/08/2019   CREATININE 0.72 04/08/2019   BILITOT 0.5 10/17/2018   ALKPHOS 44 10/17/2018   AST 18 10/17/2018   ALT 17 10/17/2018   PROT 7.7 10/17/2018   ALBUMIN 4.7 10/17/2018   CALCIUM 10.0 04/08/2019   ANIONGAP 10 10/17/2018   No results found for: CHOL No results found for: HDL No results found for:  LDLCALC No results found for: TRIG No results found for: CHOLHDL No results found for: 12/16/2018    Assessment & Plan:  Report of the electrocardiogram. Normal sinus rhythm no acute changes were noted.  Right axis deviation is present Problem List Items Addressed This Visit       Cardiovascular and Mediastinum   Mitral valve prolapse  We will schedule an echocardiogram        Digestive   Gastroesophageal reflux disease with esophagitis without hemorrhage    - The patient's GERD is stable on medication.  - Instructed the patient to avoid eating spicy and acidic foods, as well as foods high in fat. - Instructed the patient to avoid eating large meals or meals 2-3 hours prior to sleeping.        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  - I informed the patient that smoking puts them at increased risk for cancer, COPD, hypertension, and more.  - Informed the patient to seek help if they begin to have trouble breathing, develop chest pain, start to cough up blood, feel faint, or pass out.      Chest pain - Primary    Due to reflux esophagitis      Relevant Orders   EKG 12-Lead   OB RESULTS CONSOLE GC/Chlamydia (Completed)    Meds ordered this encounter  Medications   omeprazole (PRILOSEC) 20 MG capsule    Sig: Take 1 capsule (20 mg total) by mouth daily.    Dispense:  30 capsule    Refill:  3    Follow-up: No follow-ups on file.    Corky Downs, MD

## 2021-06-07 NOTE — Assessment & Plan Note (Signed)
Due to reflux esophagitis

## 2021-06-07 NOTE — Assessment & Plan Note (Signed)
-   The patient's GERD is stable on medication.  - Instructed the patient to avoid eating spicy and acidic foods, as well as foods high in fat. - Instructed the patient to avoid eating large meals or meals 2-3 hours prior to sleeping. 

## 2021-06-07 NOTE — Assessment & Plan Note (Addendum)
Neg  Test  For Hep b

## 2021-06-07 NOTE — Progress Notes (Deleted)
G

## 2021-06-07 NOTE — Assessment & Plan Note (Signed)
-   I instructed the patient to stop smoking and provided them with smoking cessation materials.  - I informed the patient that smoking puts them at increased risk for cancer, COPD, hypertension, and more.  - Informed the patient to seek help if they begin to have trouble breathing, develop chest pain, start to cough up blood, feel faint, or pass out.  

## 2021-06-07 NOTE — Assessment & Plan Note (Signed)
We will schedule an echocardiogram.

## 2021-06-23 ENCOUNTER — Ambulatory Visit (HOSPITAL_COMMUNITY): Payer: Medicaid Other | Admitting: Student in an Organized Health Care Education/Training Program

## 2021-06-24 ENCOUNTER — Ambulatory Visit (HOSPITAL_COMMUNITY): Payer: Medicaid Other | Admitting: Student in an Organized Health Care Education/Training Program

## 2021-06-28 ENCOUNTER — Other Ambulatory Visit: Payer: Self-pay

## 2021-07-01 ENCOUNTER — Other Ambulatory Visit: Payer: Self-pay

## 2021-07-01 ENCOUNTER — Encounter (HOSPITAL_BASED_OUTPATIENT_CLINIC_OR_DEPARTMENT_OTHER): Payer: Self-pay | Admitting: Emergency Medicine

## 2021-07-01 ENCOUNTER — Ambulatory Visit
Admission: EM | Admit: 2021-07-01 | Discharge: 2021-07-01 | Disposition: A | Discharge status: Home / Self Care | Payer: Medicaid Other

## 2021-07-01 ENCOUNTER — Emergency Department (HOSPITAL_BASED_OUTPATIENT_CLINIC_OR_DEPARTMENT_OTHER)
Admission: EM | Admit: 2021-07-01 | Discharge: 2021-07-02 | Disposition: A | Discharge status: Home / Self Care | Payer: Medicaid Other | Attending: Emergency Medicine | Admitting: Emergency Medicine

## 2021-07-01 ENCOUNTER — Emergency Department (HOSPITAL_BASED_OUTPATIENT_CLINIC_OR_DEPARTMENT_OTHER): Payer: Medicaid Other | Admitting: Radiology

## 2021-07-01 DIAGNOSIS — R519 Headache, unspecified: Secondary | ICD-10-CM | POA: Diagnosis present

## 2021-07-01 DIAGNOSIS — J019 Acute sinusitis, unspecified: Secondary | ICD-10-CM | POA: Diagnosis not present

## 2021-07-01 DIAGNOSIS — M542 Cervicalgia: Secondary | ICD-10-CM | POA: Diagnosis not present

## 2021-07-01 DIAGNOSIS — Z20822 Contact with and (suspected) exposure to covid-19: Secondary | ICD-10-CM | POA: Diagnosis not present

## 2021-07-01 DIAGNOSIS — N9489 Other specified conditions associated with female genital organs and menstrual cycle: Secondary | ICD-10-CM | POA: Diagnosis not present

## 2021-07-01 DIAGNOSIS — F17290 Nicotine dependence, other tobacco product, uncomplicated: Secondary | ICD-10-CM | POA: Diagnosis not present

## 2021-07-01 DIAGNOSIS — F419 Anxiety disorder, unspecified: Secondary | ICD-10-CM | POA: Diagnosis not present

## 2021-07-01 DIAGNOSIS — J4520 Mild intermittent asthma, uncomplicated: Secondary | ICD-10-CM | POA: Diagnosis not present

## 2021-07-01 DIAGNOSIS — H53149 Visual discomfort, unspecified: Secondary | ICD-10-CM

## 2021-07-01 LAB — CBC WITH DIFFERENTIAL/PLATELET
Abs Immature Granulocytes: 0.03 K/uL (ref 0.00–0.07)
Basophils Absolute: 0 K/uL (ref 0.0–0.1)
Basophils Relative: 0 %
Eosinophils Absolute: 0.1 K/uL (ref 0.0–0.5)
Eosinophils Relative: 1 %
HCT: 42.6 % (ref 36.0–46.0)
Hemoglobin: 14.3 g/dL (ref 12.0–15.0)
Immature Granulocytes: 0 %
Lymphocytes Relative: 22 %
Lymphs Abs: 2.1 K/uL (ref 0.7–4.0)
MCH: 28.7 pg (ref 26.0–34.0)
MCHC: 33.6 g/dL (ref 30.0–36.0)
MCV: 85.5 fL (ref 80.0–100.0)
Monocytes Absolute: 0.6 K/uL (ref 0.1–1.0)
Monocytes Relative: 6 %
Neutro Abs: 6.7 K/uL (ref 1.7–7.7)
Neutrophils Relative %: 71 %
Platelets: 280 K/uL (ref 150–400)
RBC: 4.98 MIL/uL (ref 3.87–5.11)
RDW: 12.1 % (ref 11.5–15.5)
WBC: 9.4 K/uL (ref 4.0–10.5)
nRBC: 0 % (ref 0.0–0.2)

## 2021-07-01 LAB — URINALYSIS, ROUTINE W REFLEX MICROSCOPIC
Bilirubin Urine: NEGATIVE
Glucose, UA: NEGATIVE mg/dL
Hgb urine dipstick: NEGATIVE
Leukocytes,Ua: NEGATIVE
Nitrite: NEGATIVE
Protein, ur: NEGATIVE mg/dL
Specific Gravity, Urine: 1.005 (ref 1.005–1.030)
pH: 6 (ref 5.0–8.0)

## 2021-07-01 LAB — MONONUCLEOSIS SCREEN: Mono Screen: NEGATIVE

## 2021-07-01 LAB — BASIC METABOLIC PANEL
Anion gap: 12 (ref 5–15)
BUN: 6 mg/dL (ref 6–20)
CO2: 24 mmol/L (ref 22–32)
Calcium: 9.9 mg/dL (ref 8.9–10.3)
Chloride: 100 mmol/L (ref 98–111)
Creatinine, Ser: 0.53 mg/dL (ref 0.44–1.00)
GFR, Estimated: >60 mL/min (ref >60)
Glucose, Bld: 87 mg/dL (ref 70–99)
Potassium: 3.7 mmol/L (ref 3.5–5.1)
Sodium: 136 mmol/L (ref 135–145)

## 2021-07-01 LAB — GROUP A STREP BY PCR: Group A Strep by PCR: NOT DETECTED

## 2021-07-01 LAB — HCG, SERUM, QUALITATIVE: Preg, Serum: NEGATIVE

## 2021-07-01 LAB — RESP PANEL BY RT-PCR (FLU A&B, COVID) ARPGX2
Influenza A by PCR: NEGATIVE
Influenza B by PCR: NEGATIVE
SARS Coronavirus 2 by RT PCR: NEGATIVE

## 2021-07-01 MED ORDER — DIPHENHYDRAMINE HCL 50 MG/ML IJ SOLN
25.0000 mg | Freq: Once | INTRAMUSCULAR | Status: AC
  Administered 2021-07-01: 25 mg via INTRAVENOUS
  Filled 2021-07-01: qty 1

## 2021-07-01 MED ORDER — ACETAMINOPHEN 500 MG PO TABS
1000.0000 mg | ORAL_TABLET | Freq: Once | ORAL | Status: DC
  Filled 2021-07-01: qty 2

## 2021-07-01 MED ORDER — SODIUM CHLORIDE 0.9 % IV BOLUS (SEPSIS)
1000.0000 mL | Freq: Once | INTRAVENOUS | Status: AC
  Administered 2021-07-01: 1000 mL via INTRAVENOUS

## 2021-07-01 MED ORDER — METOCLOPRAMIDE HCL 5 MG/ML IJ SOLN
10.0000 mg | Freq: Once | INTRAMUSCULAR | Status: AC
  Administered 2021-07-01: 10 mg via INTRAVENOUS
  Filled 2021-07-01: qty 2

## 2021-07-01 MED ORDER — KETOROLAC TROMETHAMINE 30 MG/ML IJ SOLN
30.0000 mg | Freq: Once | INTRAMUSCULAR | Status: AC
  Administered 2021-07-01: 30 mg via INTRAVENOUS
  Filled 2021-07-01: qty 1

## 2021-07-01 MED ORDER — SODIUM CHLORIDE 0.9 % IV SOLN
1000.0000 mL | INTRAVENOUS | Status: DC
  Administered 2021-07-02: 1000 mL via INTRAVENOUS

## 2021-07-01 NOTE — ED Provider Notes (Signed)
[facility] EMERGENCY DEPT Provider Note   CSN: [id number] Arrival date & time: 07/01/21  1616     History Chief Complaint  Patient presents with   Headache    Karen Huang is a 25 y.o. female.  HPI Patient reports has had symptoms for about 3 days.  She reports she had minor scratchy sore throat and a lot of pressure and congestion behind her eyes nose and cheeks at first onset.  She has had some nasal congestion and drainage.  Patient reports on the first day that she is sick she did have an episode of vomiting and diarrhea.  These have not persisted.  She reports now however she has a much worse headache.  Headache is now generalized going from her face up to the top and back of her head and into her neck.  She reports the whole head is throbbing.  Pain is severe.  She has tried decongestants, ibuprofen and Tylenol without relief at home.  She does not have any abdominal pain.  She reports she has had a mild cough but does not feel that she has any chest pain or shortness of breath.  Patient reports her daughter was ill with a cold-like illness diagnosis virus by pediatrician.    Past Medical History:  Diagnosis Date   Allergies    Anxiety    Asthma    Depression    Headache    Hx of chlamydia infection    age 46   SVD (spontaneous vaginal delivery) 06/21/2018    Patient Active Problem List   Diagnosis Date Noted   Chest pain 06/07/2021   Gastroesophageal reflux disease with esophagitis without hemorrhage 06/07/2021   Encounter for preventive care 02/15/2021   Mild intermittent asthma without complication 02/15/2021   Uses birth control 04/06/2020   Anxiety 04/06/2020   Mitral valve prolapse 04/06/2020   Vaping-related disorder 04/06/2020   Viral upper respiratory tract infection 02/21/2020   Wheezing 02/21/2020   Hepatitis C antibody test positive 11/28/2018   PIH (pregnancy induced hypertension), third trimester 06/21/2018   SVD (spontaneous  vaginal delivery) 06/21/2018   Postpartum care following vaginal delivery 06/21/2018    Past Surgical History:  Procedure Laterality Date   TONSILLECTOMY       OB History     Gravida  1   Para  1   Term  1   Preterm  0   AB  0   Living  1      SAB  0   IAB  0   Ectopic  0   Multiple  0   Live Births  1           Family History  Problem Relation Age of Onset   Hypertension Father    Heart Problems Father    Hypertension Sister    Hypertension Paternal Grandmother    COPD Paternal Grandmother    Diabetes Paternal Grandmother    Heart Problems Paternal Grandmother    Alcohol abuse Mother    ADD / ADHD Brother    COPD Maternal Grandmother     Social History   Tobacco Use   Smoking status: Some Days    Types: E-cigarettes    Last attempt to quit: 11/17/2017    Years since quitting: 3.6   Smokeless tobacco: Never  Vaping Use   Vaping Use: Some days   Last attempt to quit: 11/03/2017  Substance Use Topics   Alcohol use: No   Drug use: No  Home Medications Prior to Admission medications   Medication Sig Start Date End Date Taking? Authorizing Provider  azithromycin (ZITHROMAX) 250 MG tablet Take 1 tablet (250 mg total) by mouth daily. 07/02/21  Yes Zierra Laroque, Lebron Conners, MD  loratadine-pseudoephedrine (CLARITIN-D 12 HOUR) 5-120 MG tablet Take 1 tablet by mouth 2 (two) times daily. 07/02/21  Yes Arby Barrette, MD  ALPRAZolam Prudy Feeler) 0.5 MG tablet Take 1 tablet (0.5 mg total) by mouth 2 (two) times daily. Patient not taking: Reported on 04/29/2021 05/21/20   Corky Downs, MD  FLUoxetine (PROZAC) 40 MG capsule TAKE 1 CAPSULE BY MOUTH EVERY DAY 04/19/21   Corky Downs, MD  omeprazole (PRILOSEC) 20 MG capsule Take 1 capsule (20 mg total) by mouth daily. 06/07/21   Corky Downs, MD    Allergies    Amoxicillin  Review of Systems   Review of Systems 10 systems reviewed and negative except as per HPI. Physical Exam Updated Vital Signs BP 107/71    Pulse 70   Temp 98.2 F (36.8 C) (Oral)   Resp 18   Ht 5\' 5"  (1.651 m)   Wt 67.1 kg   LMP 06/21/2021 (Approximate)   SpO2 98%   BMI 24.62 kg/m   Physical Exam Constitutional:      Comments: Patient is alert and nontoxic.  She is tearful.  No respiratory distress.  HENT:     Head: Normocephalic and atraumatic.     Right Ear: Tympanic membrane normal.     Left Ear: Tympanic membrane normal.     Nose:     Comments: Patient has very congested nasal mucosa.  And facial pain around the forehead and nose.    Mouth/Throat:     Mouth: Mucous membranes are moist.     Pharynx: Oropharynx is clear.  Eyes:     Extraocular Movements: Extraocular movements intact.     Pupils: Pupils are equal, round, and reactive to light.  Neck:     Comments: Patient reports soreness in her neck but she has no meningeal signs.  No lymphadenopathy.  Neck is supple Cardiovascular:     Rate and Rhythm: Normal rate and regular rhythm.  Pulmonary:     Effort: Pulmonary effort is normal.     Breath sounds: Normal breath sounds.  Abdominal:     General: There is no distension.     Palpations: Abdomen is soft.     Tenderness: There is no abdominal tenderness. There is no guarding.  Musculoskeletal:        General: No swelling. Normal range of motion.     Cervical back: Neck supple.     Right lower leg: No edema.     Left lower leg: No edema.  Skin:    General: Skin is warm and dry.  Neurological:     General: No focal deficit present.     Mental Status: She is oriented to person, place, and time.     Motor: No weakness.     Coordination: Coordination normal.  Psychiatric:     Comments: Tearful and anxious    ED Results / Procedures / Treatments   Labs (all labs ordered are listed, but only abnormal results are displayed) Labs Reviewed  URINALYSIS, ROUTINE W REFLEX MICROSCOPIC - Abnormal; Notable for the following components:      Result Value   Color, Urine COLORLESS (*)    Ketones, ur TRACE (*)     All other components within normal limits  RESP PANEL BY RT-PCR (FLU A&B, COVID) [id number]  GROUP A STREP BY PCR  BASIC METABOLIC PANEL  CBC WITH DIFFERENTIAL/PLATELET  HCG, SERUM, QUALITATIVE  MONONUCLEOSIS SCREEN    EKG None  Radiology DG Chest 2 View  Result Date: 07/01/2021 CLINICAL DATA:  Cough EXAM: CHEST - 2 VIEW COMPARISON:  None. FINDINGS: The heart size and mediastinal contours are within normal limits. Both lungs are clear. The visualized skeletal structures are unremarkable. IMPRESSION: Negative Electronically Signed   By: Charlett Nose M.D.   On: 07/01/2021 18:31    Procedures Procedures   Medications Ordered in ED Medications  acetaminophen (TYLENOL) tablet 1,000 mg (1,000 mg Oral Not Given 07/01/21 2313)  sodium chloride 0.9 % bolus 1,000 mL (1,000 mLs Intravenous New Bag/Given 07/01/21 2236)    Followed by  0.9 %  sodium chloride infusion (1,000 mLs Intravenous New Bag/Given 07/02/21 0046)  azithromycin (ZITHROMAX) tablet 500 mg (has no administration in time range)  acetaminophen (TYLENOL) tablet 1,000 mg (has no administration in time range)  ondansetron (ZOFRAN-ODT) disintegrating tablet 4 mg (has no administration in time range)  ketorolac (TORADOL) 30 MG/ML injection 30 mg (30 mg Intravenous Given 07/01/21 2233)  diphenhydrAMINE (BENADRYL) injection 25 mg (25 mg Intravenous Given 07/01/21 2234)  metoCLOPramide (REGLAN) injection 10 mg (10 mg Intravenous Given 07/01/21 2232)    ED Course  I have reviewed the triage vital signs and the nursing notes.  Pertinent labs & imaging results that were available during my care of the patient were reviewed by me and considered in my medical decision making (see chart for details).    MDM Rules/Calculators/A&P                           Patient presents resolving.  Symptoms sound suspicious for viral illness.  She has contact via her child with recent viral illness.  Patient however has extensive facial pain and  pain that is concentrating behind her nose cheeks and forehead.  Patient is afebrile and nontachycardic.  No signs of sepsis.  No leukocytosis.  Patient was seen in urgent care and sent with concern for possible meningitis.  I have very low suspicion for bacterial meningitis.  Patient has had multisystem symptoms for 3 days and remains nontoxic with normal vital signs and afebrile.  Viral meningitis is possible but have low suspicion for emergent condition such as HSV.  Patient is not encephalopathic.  Mental status is very clear.  She is significantly improved after hydration and symptomatic treatment.  Most prominent feature at this time is a lot of facial pain behind the cheeks and nose and brow.  Will opt to treat for sinusitis.  Patient is very congested.  Careful return precautions reviewed. Final Clinical Impression(s) / ED Diagnoses Final diagnoses:  Acute sinusitis, recurrence not specified, unspecified location  Acute nonintractable headache, unspecified headache type    Rx / DC Orders ED Discharge Orders          Ordered    azithromycin (ZITHROMAX) 250 MG tablet  Daily        07/02/21 0044    loratadine-pseudoephedrine (CLARITIN-D 12 HOUR) 5-120 MG tablet  2 times daily        07/02/21 0044             Arby Barrette, MD 07/02/21 [phone]

## 2021-07-01 NOTE — ED Provider Notes (Signed)
[facility]    CSN: [id number] Arrival date & time: 07/01/21  1437      History   Chief Complaint Chief Complaint  Patient presents with   Headache   Torticollis    HPI Karen Huang is a 25 y.o. female.   Patient presents with severe headache, neck pain, neck stiffness, photophobia, fever, nasal congestion that started approximately 3 days ago.  Patient reports that she is having a severe headache rated 9/10 on pain scale that starts in the front part of her face and radiates across her head and into the neck.  Patient has difficulty moving neck.  Patient reports that her eyes hurt to move them.  Also having some light sensitivity.  Denies any head injury or neck injury.  Patient reports that she felt feverish but is not sure of T-max at home.  Patient has taken over-the-counter cough and cold medications including Tylenol with no improvement in symptoms.  Patient's daughter had similar symptoms recently.  Denies any chest pain or shortness of breath.  Denies nausea, vomiting, abdominal pain, diarrhea.   Headache  Past Medical History:  Diagnosis Date   Allergies    Anxiety    Asthma    Depression    Headache    Hx of chlamydia infection    age 39   SVD (spontaneous vaginal delivery) 06/21/2018    Patient Active Problem List   Diagnosis Date Noted   Chest pain 06/07/2021   Gastroesophageal reflux disease with esophagitis without hemorrhage 06/07/2021   Encounter for preventive care 02/15/2021   Mild intermittent asthma without complication 02/15/2021   Uses birth control 04/06/2020   Anxiety 04/06/2020   Mitral valve prolapse 04/06/2020   Vaping-related disorder 04/06/2020   Viral upper respiratory tract infection 02/21/2020   Wheezing 02/21/2020   Hepatitis C antibody test positive 11/28/2018   PIH (pregnancy induced hypertension), third trimester 06/21/2018   SVD (spontaneous vaginal delivery) 06/21/2018   Postpartum care following vaginal  delivery 06/21/2018    Past Surgical History:  Procedure Laterality Date   TONSILLECTOMY      OB History     Gravida  1   Para  1   Term  1   Preterm  0   AB  0   Living  1      SAB  0   IAB  0   Ectopic  0   Multiple  0   Live Births  1            Home Medications    Prior to Admission medications   Medication Sig Start Date End Date Taking? Authorizing Provider  ALPRAZolam Prudy Feeler) 0.5 MG tablet Take 1 tablet (0.5 mg total) by mouth 2 (two) times daily. Patient not taking: Reported on 04/29/2021 05/21/20   Corky Downs, MD  FLUoxetine (PROZAC) 40 MG capsule TAKE 1 CAPSULE BY MOUTH EVERY DAY 04/19/21   Corky Downs, MD  omeprazole (PRILOSEC) 20 MG capsule Take 1 capsule (20 mg total) by mouth daily. 06/07/21   Corky Downs, MD    Family History Family History  Problem Relation Age of Onset   Hypertension Father    Heart Problems Father    Hypertension Sister    Hypertension Paternal Grandmother    COPD Paternal Grandmother    Diabetes Paternal Grandmother    Heart Problems Paternal Grandmother    Alcohol abuse Mother    ADD / ADHD Brother    COPD Maternal Grandmother  Social History Social History   Tobacco Use   Smoking status: Some Days    Types: E-cigarettes    Last attempt to quit: 11/17/2017    Years since quitting: 3.6   Smokeless tobacco: Never  Vaping Use   Vaping Use: Some days   Last attempt to quit: 11/03/2017  Substance Use Topics   Alcohol use: No   Drug use: No     Allergies   Amoxicillin   Review of Systems Review of Systems Per HPI  Physical Exam Triage Vital Signs ED Triage Vitals [07/01/21 1538]  Enc Vitals Group     BP 137/76     Pulse Rate (!) 102     Resp 16     Temp 98.1 F (36.7 C)     Temp Source Oral     SpO2 96 %     Weight      Height      Head Circumference      Peak Flow      Pain Score 9     Pain Loc      Pain Edu?      Excl. in GC?    No data found.  Updated Vital Signs BP  137/76 (BP Location: Left Arm)   Pulse (!) 102   Temp 98.1 F (36.7 C) (Oral)   Resp 16   SpO2 96%   Visual Acuity Right Eye Distance:   Left Eye Distance:   Bilateral Distance:    Right Eye Near:   Left Eye Near:    Bilateral Near:     Physical Exam Constitutional:      General: She is not in acute distress.    Appearance: Normal appearance. She is ill-appearing and toxic-appearing. She is not diaphoretic.  HENT:     Head: Normocephalic and atraumatic.     Right Ear: Tympanic membrane and ear canal normal.     Left Ear: Tympanic membrane and ear canal normal.     Nose: Congestion present.     Mouth/Throat:     Mouth: Mucous membranes are moist.     Pharynx: No posterior oropharyngeal erythema.  Eyes:     Extraocular Movements: Extraocular movements intact.     Conjunctiva/sclera: Conjunctivae normal.     Pupils: Pupils are equal, round, and reactive to light.  Cardiovascular:     Rate and Rhythm: Regular rhythm. Tachycardia present.     Pulses: Normal pulses.     Heart sounds: Normal heart sounds.  Pulmonary:     Effort: Pulmonary effort is normal. No respiratory distress.     Breath sounds: Normal breath sounds. No stridor. No wheezing, rhonchi or rales.  Abdominal:     General: Abdomen is flat. Bowel sounds are normal. There is no distension.     Palpations: Abdomen is soft.     Tenderness: There is no abdominal tenderness.  Musculoskeletal:     Cervical back: Torticollis present. Pain with movement and muscular tenderness present. No spinous process tenderness.  Skin:    General: Skin is warm and dry.  Neurological:     General: No focal deficit present.     Mental Status: She is alert and oriented to person, place, and time. Mental status is at baseline.     Cranial Nerves: Cranial nerves 2-12 are intact.     Sensory: Sensation is intact.     Motor: Motor function is intact.     Coordination: Coordination is intact.     Gait: Gait is  intact.  Psychiatric:         Mood and Affect: Mood normal.        Behavior: Behavior normal.        Thought Content: Thought content normal.        Judgment: Judgment normal.     UC Treatments / Results  Labs (all labs ordered are listed, but only abnormal results are displayed) Labs Reviewed - No data to display  EKG   Radiology No results found.  Procedures Procedures (including critical care time)  Medications Ordered in UC Medications - No data to display  Initial Impression / Assessment and Plan / UC Course  I have reviewed the triage vital signs and the nursing notes.  Pertinent labs & imaging results that were available during my care of the patient were reviewed by me and considered in my medical decision making (see chart for details).     Highly suspicious of meningitis given patient's clinical picture.  Otherwise could be viral etiology.  Advised patient to go to the hospital for further evaluation and management.  Patient was agreeable with plan.  Vital signs stable at discharge.  Agree with patient self transport to the hospital. Final Clinical Impressions(s) / UC Diagnoses   Final diagnoses:  Severe headache  Photophobia  Neck pain     Discharge Instructions      Please go to the hospital as soon as you leave urgent care for further evaluation and management.     ED Prescriptions   None    PDMP not reviewed this encounter.   Gustavus Bryant, [place] 07/01/21 1610

## 2021-07-01 NOTE — ED Triage Notes (Signed)
States has had h/a and nausea , sensitive to light to states  she has  vomited  has not bumped her head , seen at [facility]

## 2021-07-01 NOTE — ED Triage Notes (Addendum)
Severe headache, neck pain, and photophobia/eye pain, visual disturbances starting 3 days prior. Has tried allergy medication, cold/flu medications, tylenol, ibuprofen without relief. Did take tylenol prior to arrival. "Feels like a burning through my head, neck, and into my eyes." Reports fever initially, but then began taking tylenol ibuprofen. States she vomited twice the first two days. No longer has an appetite. Denies prior hx of same, head or neck injury. Describes headache as worse headache she's ever had.

## 2021-07-01 NOTE — Discharge Instructions (Signed)
Please go to the hospital as soon as you leave urgent care for further evaluation and management. 

## 2021-07-01 NOTE — ED Provider Notes (Signed)
Emergency Medicine Provider Triage Evaluation Note  Karen Huang , a 25 y.o. female  was evaluated in triage.  Pt complains of nasal congestion, sore throat, cough, fever, headache.  Symptoms have been going on for 4 days.  Also having a lot of pain in her back and neck. Having nausea.   Review of Systems  Positive: Cough, fever, congestion, HA Negative: Abd pain  Physical Exam  BP (!) 118/93 (BP Location: Left Arm)   Pulse 93   Temp 98.9 F (37.2 C)   Resp 16   Ht 5\' 5"  (1.651 m)   Wt 67.1 kg   SpO2 98%   BMI 24.62 kg/m  Gen:   Awake, no distress   Resp:  Normal effort  MSK:   Moves extremities without difficulty  Other:  Full active range of motion of the head.  Medical Decision Making  Medically screening exam initiated at 5:15 PM.  Appropriate orders placed.  Naviah Belfield was informed that the remainder of the evaluation will be completed by another provider, this initial triage assessment does not replace that evaluation, and the importance of remaining in the ED until their evaluation is complete.  Resp panel, cxr   [name], PA-C 07/01/21 1717    07/03/21, MD 07/06/21 1104

## 2021-07-02 MED ORDER — AZITHROMYCIN 250 MG PO TABS: 250.0000 mg | ORAL_TABLET | Freq: Every day | ORAL | 0 refills | Status: DC

## 2021-07-02 MED ORDER — ONDANSETRON 4 MG PO TBDP
4.0000 mg | ORAL_TABLET | Freq: Once | ORAL | Status: AC
  Administered 2021-07-02: 4 mg via ORAL
  Filled 2021-07-02: qty 1

## 2021-07-02 MED ORDER — ACETAMINOPHEN 500 MG PO TABS
1000.0000 mg | ORAL_TABLET | Freq: Once | ORAL | Status: AC
  Administered 2021-07-02: 1000 mg via ORAL

## 2021-07-02 MED ORDER — CLARITIN-D 12 HOUR 5-120 MG PO TB12: 1.0000 | ORAL_TABLET | Freq: Two times a day (BID) | ORAL | 0 refills | Status: DC

## 2021-07-02 MED ORDER — AZITHROMYCIN 250 MG PO TABS
500.0000 mg | ORAL_TABLET | Freq: Once | ORAL | Status: AC
  Administered 2021-07-02: 500 mg via ORAL
  Filled 2021-07-02: qty 2

## 2021-07-02 NOTE — Discharge Instructions (Addendum)
1.  Stay well-hydrated.  Take extra strength Tylenol every 6 hours for pain.  You may also take ibuprofen 600 mg every 6-8 hours.  Take Zofran as prescribed if needed for nausea. 2.  Your facial pain suggests sinus infection.  You are very congested at this time.  You have been given a course of Zithromax to take.  Your first dose was given in the emergency department, start your prescription tomorrow evening. 3.  Schedule a follow-up appointment with your doctor in 3 to 5 days.  Return to the emergency department if you are having worsening new or concerning symptoms.

## 2021-07-05 ENCOUNTER — Ambulatory Visit (INDEPENDENT_AMBULATORY_CARE_PROVIDER_SITE_OTHER): Payer: Medicaid Other | Admitting: Internal Medicine

## 2021-07-05 ENCOUNTER — Encounter: Payer: Self-pay | Admitting: Internal Medicine

## 2021-07-05 ENCOUNTER — Other Ambulatory Visit: Payer: Medicaid Other

## 2021-07-05 ENCOUNTER — Other Ambulatory Visit: Payer: Self-pay

## 2021-07-05 VITALS — BP 124/71 | HR 79 | Ht 65.0 in | Wt 142.8 lb

## 2021-07-05 DIAGNOSIS — R062 Wheezing: Secondary | ICD-10-CM

## 2021-07-05 DIAGNOSIS — J4520 Mild intermittent asthma, uncomplicated: Secondary | ICD-10-CM | POA: Diagnosis not present

## 2021-07-05 DIAGNOSIS — J069 Acute upper respiratory infection, unspecified: Secondary | ICD-10-CM | POA: Diagnosis not present

## 2021-07-05 DIAGNOSIS — I341 Nonrheumatic mitral (valve) prolapse: Secondary | ICD-10-CM | POA: Diagnosis not present

## 2021-07-05 DIAGNOSIS — F419 Anxiety disorder, unspecified: Secondary | ICD-10-CM

## 2021-07-05 DIAGNOSIS — J329 Chronic sinusitis, unspecified: Secondary | ICD-10-CM

## 2021-07-05 LAB — POC COVID19 BINAXNOW: SARS Coronavirus 2 Ag: NEGATIVE

## 2021-07-05 MED ORDER — AZITHROMYCIN 250 MG PO TABS: ORAL_TABLET | ORAL | 0 refills | Status: AC

## 2021-07-05 NOTE — Assessment & Plan Note (Signed)
Patient is referred to the psychiatrist °

## 2021-07-05 NOTE — Progress Notes (Signed)
Established Patient Office Visit  Subjective:  Patient ID: Karen Huang, female    DOB: Apr 23, 1996  Age: 24 y.o. MRN: [id number]  CC:  Chief Complaint  Patient presents with   Headache    Patient complains of head pain and sinus congestion x 1 week.     Headache  Associated symptoms include sinus pressure and a sore throat.   Karen Huang presents for sinus  headache  and bronchitis  Past Medical History:  Diagnosis Date   Allergies    Anxiety    Asthma    Depression    Headache    Hx of chlamydia infection    age 37   SVD (spontaneous vaginal delivery) 06/21/2018    Past Surgical History:  Procedure Laterality Date   TONSILLECTOMY      Family History  Problem Relation Age of Onset   Hypertension Father    Heart Problems Father    Hypertension Sister    Hypertension Paternal Grandmother    COPD Paternal Grandmother    Diabetes Paternal Grandmother    Heart Problems Paternal Grandmother    Alcohol abuse Mother    ADD / ADHD Brother    COPD Maternal Grandmother     Social History   Socioeconomic History   Marital status: Single    Spouse name: Not on file   Number of children: Not on file   Years of education: Not on file   Highest education level: Not on file  Occupational History   Not on file  Tobacco Use   Smoking status: Some Days    Types: E-cigarettes    Last attempt to quit: 11/17/2017    Years since quitting: 3.6   Smokeless tobacco: Never  Vaping Use   Vaping Use: Some days   Last attempt to quit: 11/03/2017  Substance and Sexual Activity   Alcohol use: No   Drug use: No   Sexual activity: Yes    Birth control/protection: Injection  Other Topics Concern   Not on file  Social History Narrative   Not on file   Social Determinants of Health   Financial Resource Strain: Not on file  Food Insecurity: Food Insecurity Present   Worried About Running Out of Food in the Last Year: Sometimes true   Ran Out of Food in the Last  Year: Sometimes true  Transportation Needs: No Transportation Needs   Lack of Transportation (Medical): No   Lack of Transportation (Non-Medical): No  Physical Activity: Not on file  Stress: Not on file  Social Connections: Not on file  Intimate Partner Violence: Not on file     Current Outpatient Medications:    ALPRAZolam (XANAX) 0.5 MG tablet, Take 1 tablet (0.5 mg total) by mouth 2 (two) times daily. (Patient not taking: Reported on 04/29/2021), Disp: 60 tablet, Rfl: 0   azithromycin (ZITHROMAX) 250 MG tablet, Take 1 tablet (250 mg total) by mouth daily., Disp: 4 tablet, Rfl: 0   FLUoxetine (PROZAC) 40 MG capsule, TAKE 1 CAPSULE BY MOUTH EVERY DAY, Disp: 90 capsule, Rfl: 3   loratadine-pseudoephedrine (CLARITIN-D 12 HOUR) 5-120 MG tablet, Take 1 tablet by mouth 2 (two) times daily., Disp: 20 tablet, Rfl: 0   omeprazole (PRILOSEC) 20 MG capsule, Take 1 capsule (20 mg total) by mouth daily., Disp: 30 capsule, Rfl: 3   Allergies  Allergen Reactions   Amoxicillin Rash    Allergy is from childhood; was taken to the hospital by her mother Has patient had a PCN reaction  causing immediate rash, facial/tongue/throat swelling, SOB or lightheadedness with hypotension: Unknown Has patient had a PCN reaction causing severe rash involving mucus membranes or skin necrosis: No Has patient had a PCN reaction that required hospitalization: No Has patient had a PCN reaction occurring within the last 10 years: No If all of the above answers are "NO", then may proceed with Cephalos    ROS Review of Systems  Constitutional:  Positive for fatigue.  HENT:  Positive for congestion, postnasal drip, sinus pressure, sinus pain and sore throat. Negative for nosebleeds.   Eyes: Negative.   Respiratory: Negative.    Cardiovascular: Negative.   Gastrointestinal: Negative.   Endocrine: Negative.   Genitourinary: Negative.   Musculoskeletal: Negative.   Skin: Negative.   Allergic/Immunologic: Negative.    Neurological:  Positive for headaches.  Hematological: Negative.   Psychiatric/Behavioral: Negative.    All other systems reviewed and are negative.    Objective:    Physical Exam Vitals reviewed.  Constitutional:      Appearance: Normal appearance.  HENT:     Mouth/Throat:     Mouth: Mucous membranes are moist.  Eyes:     Pupils: Pupils are equal, round, and reactive to light.  Neck:     Vascular: No carotid bruit.  Cardiovascular:     Rate and Rhythm: Normal rate and regular rhythm.     Pulses: Normal pulses.     Heart sounds: Normal heart sounds.  Pulmonary:     Effort: Pulmonary effort is normal.     Breath sounds: Normal breath sounds.  Abdominal:     General: Bowel sounds are normal.     Palpations: Abdomen is soft. There is no hepatomegaly, splenomegaly or mass.     Tenderness: There is no abdominal tenderness.     Hernia: No hernia is present.  Musculoskeletal:        General: No tenderness.     Cervical back: Neck supple.     Right lower leg: No edema.     Left lower leg: No edema.  Skin:    Findings: No rash.  Neurological:     Mental Status: She is alert and oriented to person, place, and time.     Motor: No weakness.  Psychiatric:        Mood and Affect: Mood and affect normal.        Behavior: Behavior normal.    BP 124/71   Pulse 79   Ht 5\' 5"  (1.651 m)   Wt 142 lb 12.8 oz (64.8 kg)   LMP 06/21/2021 (Approximate)   BMI 23.76 kg/m  Wt Readings from Last 3 Encounters:  07/05/21 142 lb 12.8 oz (64.8 kg)  07/01/21 147 lb 14.9 oz (67.1 kg)  06/07/21 148 lb (67.1 kg)     Health Maintenance Due  Topic Date Due   COVID-19 Vaccine (1) Never done   Pneumococcal Vaccine 52-36 Years old (1 - PCV) Never done   HPV VACCINES (1 - 2-dose series) Never done   Hepatitis C Screening  Never done   INFLUENZA VACCINE  Never done       Topic Date Due   HPV VACCINES (1 - 2-dose series) Never done    No results found for: TSH Lab Results  Component  Value Date   WBC 9.4 07/01/2021   HGB 14.3 07/01/2021   HCT 42.6 07/01/2021   MCV 85.5 07/01/2021   PLT 280 07/01/2021   Lab Results  Component Value Date   NA 136  07/01/2021   K 3.7 07/01/2021   CO2 24 07/01/2021   GLUCOSE 87 07/01/2021   BUN 6 07/01/2021   CREATININE 0.53 07/01/2021   BILITOT 0.5 10/17/2018   ALKPHOS 44 10/17/2018   AST 18 10/17/2018   ALT 17 10/17/2018   PROT 7.7 10/17/2018   ALBUMIN 4.7 10/17/2018   CALCIUM 9.9 07/01/2021   ANIONGAP 12 07/01/2021   No results found for: CHOL No results found for: HDL No results found for: LDLCALC No results found for: TRIG No results found for: CHOLHDL No results found for: JJKK9F    Assessment & Plan:   Problem List Items Addressed This Visit       Cardiovascular and Mediastinum   Mitral valve prolapse    Stable at the present time        Respiratory   Viral upper respiratory tract infection    We will give another course of Z-Pak      Mild intermittent asthma without complication    Patient was not found to be wheezing throat is pink sinuses are minimally tender Abdomen okay        Other   Wheezing    Resolved      Anxiety    Patient is referred to the psychiatrist      Other Visit Diagnoses     Sinusitis, unspecified chronicity, unspecified location    -  Primary   Relevant Orders   POC COVID-19 (Completed)       No orders of the defined types were placed in this encounter.   Follow-up: No follow-ups on file.    Corky Downs, MD

## 2021-07-05 NOTE — Assessment & Plan Note (Signed)
We will give another course of Z-Pak

## 2021-07-05 NOTE — Assessment & Plan Note (Signed)
Stable at the present time. 

## 2021-07-05 NOTE — Assessment & Plan Note (Signed)
Resolved

## 2021-07-05 NOTE — Assessment & Plan Note (Signed)
Patient was not found to be wheezing throat is pink sinuses are minimally tender Abdomen okay

## 2021-08-10 ENCOUNTER — Other Ambulatory Visit: Payer: Self-pay | Admitting: Physician Assistant

## 2021-08-10 DIAGNOSIS — M25552 Pain in left hip: Secondary | ICD-10-CM

## 2021-08-11 ENCOUNTER — Ambulatory Visit
Admission: RE | Admit: 2021-08-11 | Discharge: 2021-08-11 | Disposition: A | Discharge status: Home / Self Care | Payer: Medicaid Other | Source: Ambulatory Visit | Attending: Physician Assistant | Admitting: Physician Assistant

## 2021-08-11 ENCOUNTER — Other Ambulatory Visit: Payer: Self-pay

## 2021-08-11 DIAGNOSIS — M25552 Pain in left hip: Secondary | ICD-10-CM | POA: Diagnosis present

## 2021-10-12 ENCOUNTER — Ambulatory Visit (INDEPENDENT_AMBULATORY_CARE_PROVIDER_SITE_OTHER): Payer: Medicaid Other | Admitting: Internal Medicine

## 2021-10-12 ENCOUNTER — Other Ambulatory Visit: Payer: Self-pay

## 2021-10-12 ENCOUNTER — Encounter: Payer: Self-pay | Admitting: Internal Medicine

## 2021-10-12 VITALS — BP 119/71 | HR 83 | Ht 65.0 in | Wt 148.0 lb

## 2021-10-12 DIAGNOSIS — Z0000 Encounter for general adult medical examination without abnormal findings: Secondary | ICD-10-CM

## 2021-10-12 DIAGNOSIS — F419 Anxiety disorder, unspecified: Secondary | ICD-10-CM | POA: Diagnosis not present

## 2021-10-12 DIAGNOSIS — J4520 Mild intermittent asthma, uncomplicated: Secondary | ICD-10-CM | POA: Diagnosis not present

## 2021-10-12 DIAGNOSIS — I341 Nonrheumatic mitral (valve) prolapse: Secondary | ICD-10-CM

## 2021-10-12 DIAGNOSIS — R062 Wheezing: Secondary | ICD-10-CM | POA: Diagnosis not present

## 2021-10-12 NOTE — Assessment & Plan Note (Signed)
Stable at the present time. 

## 2021-10-12 NOTE — Assessment & Plan Note (Signed)
Ref to ob

## 2021-10-12 NOTE — Progress Notes (Signed)
New Patient Office Visit  Subjective:  Patient ID: Karen Huang, female    DOB: 17-Mar-1996  Age: 26 y.o. MRN: [id number]  CC:  Chief Complaint  Patient presents with   Headache    Patient complains of recurring headaches that has been on and off x 1 month. Patient denies vomiting, but has nausea and light sensitivity.     Headache  The current episode started more than 1 year ago. The problem occurs intermittently. The problem has been unchanged. The pain is located in the Bilateral region. The pain does not radiate. The quality of the pain is described as throbbing. The pain is at a severity of 6/10. Pertinent negatives include no abdominal pain, abnormal behavior, dizziness, loss of balance, neck pain, phonophobia, swollen glands, visual change or vomiting.  Patient presents for headache , lower back pain , it  affects her work  Past Medical History:  Diagnosis Date   Allergies    Anxiety    Asthma    Depression    Headache    Hx of chlamydia infection    age 68   SVD (spontaneous vaginal delivery) 06/21/2018     Current Outpatient Medications:    ALPRAZolam (XANAX) 0.5 MG tablet, Take 1 tablet (0.5 mg total) by mouth 2 (two) times daily. (Patient not taking: Reported on 04/29/2021), Disp: 60 tablet, Rfl: 0   azithromycin (ZITHROMAX) 250 MG tablet, Take 1 tablet (250 mg total) by mouth daily., Disp: 4 tablet, Rfl: 0   loratadine-pseudoephedrine (CLARITIN-D 12 HOUR) 5-120 MG tablet, Take 1 tablet by mouth 2 (two) times daily., Disp: 20 tablet, Rfl: 0   omeprazole (PRILOSEC) 20 MG capsule, Take 1 capsule (20 mg total) by mouth daily., Disp: 30 capsule, Rfl: 3   Past Surgical History:  Procedure Laterality Date   TONSILLECTOMY      Family History  Problem Relation Age of Onset   Hypertension Father    Heart Problems Father    Hypertension Sister    Hypertension Paternal Grandmother    COPD Paternal Grandmother    Diabetes Paternal Grandmother    Heart Problems  Paternal Grandmother    Alcohol abuse Mother    ADD / ADHD Brother    COPD Maternal Grandmother     Social History   Socioeconomic History   Marital status: Single    Spouse name: Not on file   Number of children: Not on file   Years of education: Not on file   Highest education level: Not on file  Occupational History   Not on file  Tobacco Use   Smoking status: Some Days    Types: E-cigarettes    Last attempt to quit: 11/17/2017    Years since quitting: 3.9   Smokeless tobacco: Never  Vaping Use   Vaping Use: Some days   Last attempt to quit: 11/03/2017  Substance and Sexual Activity   Alcohol use: No   Drug use: No   Sexual activity: Yes    Birth control/protection: Injection  Other Topics Concern   Not on file  Social History Narrative   Not on file   Social Determinants of Health   Financial Resource Strain: Not on file  Food Insecurity: Food Insecurity Present   Worried About Running Out of Food in the Last Year: Sometimes true   Ran Out of Food in the Last Year: Sometimes true  Transportation Needs: No Transportation Needs   Lack of Transportation (Medical): No   Lack of Transportation (Non-Medical): No  Physical Activity: Not on file  Stress: Not on file  Social Connections: Not on file  Intimate Partner Violence: Not on file    ROS Review of Systems  Gastrointestinal:  Negative for abdominal pain and vomiting.  Musculoskeletal:  Negative for neck pain.  Neurological:  Positive for headaches. Negative for dizziness and loss of balance.   Objective:   Today's Vitals: BP 119/71    Pulse 83    Ht 5\' 5"  (1.651 m)    Wt 148 lb (67.1 kg)    BMI 24.63 kg/m   Physical Exam Constitutional:      Appearance: She is well-developed.  HENT:     Head: Normocephalic.     Mouth/Throat:     Mouth: Mucous membranes are moist.  Eyes:     Extraocular Movements: Extraocular movements intact.  Cardiovascular:     Rate and Rhythm: Normal rate.     Heart sounds:  Normal heart sounds.  Pulmonary:     Effort: Pulmonary effort is normal.  Abdominal:     General: Bowel sounds are normal.     Palpations: Abdomen is soft.  Musculoskeletal:        General: Normal range of motion.     Cervical back: Normal range of motion.  Neurological:     Mental Status: She is alert.     Cranial Nerves: No cranial nerve deficit or facial asymmetry.     Coordination: Romberg sign negative.     Gait: Gait normal.  Psychiatric:        Mood and Affect: Mood normal.        Behavior: Behavior normal.    Assessment & Plan:   Problem List Items Addressed This Visit       Cardiovascular and Mediastinum   Mitral valve prolapse - Primary     Respiratory   Mild intermittent asthma without complication    Stable at the present time        Other   Wheezing    Patient is stable      Anxiety    - Patient experiencing high levels of anxiety.  - Encouraged patient to engage in relaxing activities like yoga, meditation, journaling, going for a walk, or participating in a hobby.  - Encouraged patient to reach out to trusted friends or family members about recent struggles, Patient was advised to read A book, how to stop worrying and start living, it is good book to read to control  the stress       Encounter for preventive care    Ref to ob       Outpatient Encounter Medications as of 10/12/2021  Medication Sig   ALPRAZolam (XANAX) 0.5 MG tablet Take 1 tablet (0.5 mg total) by mouth 2 (two) times daily. (Patient not taking: Reported on 04/29/2021)   azithromycin (ZITHROMAX) 250 MG tablet Take 1 tablet (250 mg total) by mouth daily.   loratadine-pseudoephedrine (CLARITIN-D 12 HOUR) 5-120 MG tablet Take 1 tablet by mouth 2 (two) times daily.   omeprazole (PRILOSEC) 20 MG capsule Take 1 capsule (20 mg total) by mouth daily.   [DISCONTINUED] FLUoxetine (PROZAC) 40 MG capsule TAKE 1 CAPSULE BY MOUTH EVERY DAY   No facility-administered encounter medications on file  as of 10/12/2021.    Follow-up: No follow-ups on file.   12/10/2021, MD

## 2021-10-12 NOTE — Assessment & Plan Note (Signed)
Patient is stable.

## 2021-10-12 NOTE — Assessment & Plan Note (Signed)
-   Patient experiencing high levels of anxiety.  - Encouraged patient to engage in relaxing activities like yoga, meditation, journaling, going for a walk, or participating in a hobby.  - Encouraged patient to reach out to trusted friends or family members about recent struggles, Patient was advised to read A book, how to stop worrying and start living, it is good book to read to control  the stress  

## 2021-10-26 ENCOUNTER — Other Ambulatory Visit: Payer: Self-pay | Admitting: Physician Assistant

## 2021-10-26 ENCOUNTER — Ambulatory Visit
Admission: RE | Admit: 2021-10-26 | Discharge: 2021-10-26 | Disposition: A | Discharge status: Home / Self Care | Payer: Medicaid Other | Source: Ambulatory Visit | Attending: Physician Assistant | Admitting: Physician Assistant

## 2021-10-26 ENCOUNTER — Other Ambulatory Visit: Payer: Self-pay

## 2021-10-26 DIAGNOSIS — M79662 Pain in left lower leg: Secondary | ICD-10-CM

## 2021-10-28 ENCOUNTER — Ambulatory Visit (INDEPENDENT_AMBULATORY_CARE_PROVIDER_SITE_OTHER): Payer: Medicaid Other | Admitting: Nurse Practitioner

## 2021-10-28 ENCOUNTER — Encounter: Payer: Self-pay | Admitting: Nurse Practitioner

## 2021-10-28 ENCOUNTER — Other Ambulatory Visit: Payer: Self-pay

## 2021-10-28 VITALS — BP 117/73 | HR 70 | Ht 65.0 in | Wt 151.2 lb

## 2021-10-28 DIAGNOSIS — I82890 Acute embolism and thrombosis of other specified veins: Secondary | ICD-10-CM | POA: Diagnosis not present

## 2021-10-28 MED ORDER — IBUPROFEN 400 MG PO TABS: 400.0000 mg | ORAL_TABLET | Freq: Three times a day (TID) | ORAL | 0 refills | Status: AC

## 2021-10-28 NOTE — Progress Notes (Signed)
Established Patient Office Visit  Subjective:  Patient ID: Karen Huang, female    DOB: [birth year]  Age: 26 y.o. MRN: [id number]  CC:  Chief Complaint  Patient presents with   Leg Pain    Patient complains of right leg pain, patient was seen at [facility] for thrombosed subcutaneous varicosity.      HPI  Karen Huang presents for left leg pain. The pt went to [facility] and was sent for left lower extremity venous doppler US. The [place] has no evidence of left lower extremity DVT. The US shows thrombosed superficial varicosity behind the left knee.  HPI   Past Medical History:  Diagnosis Date   Allergies    Anxiety    Asthma    Depression    Headache    Hx of chlamydia infection    age 32   SVD (spontaneous vaginal delivery) 06/21/2018    Past Surgical History:  Procedure Laterality Date   TONSILLECTOMY      Family History  Problem Relation Age of Onset   Hypertension Father    Heart Problems Father    Hypertension Sister    Hypertension Paternal Grandmother    COPD Paternal Grandmother    Diabetes Paternal Grandmother    Heart Problems Paternal Grandmother    Alcohol abuse Mother    ADD / ADHD Brother    COPD Maternal Grandmother     Social History   Socioeconomic History   Marital status: Single    Spouse name: Not on file   Number of children: Not on file   Years of education: Not on file   Highest education level: Not on file  Occupational History   Not on file  Tobacco Use   Smoking status: Some Days    Types: E-cigarettes    Last attempt to quit: 11/17/2017    Years since quitting: 3.9   Smokeless tobacco: Never  Vaping Use   Vaping Use: Some days   Last attempt to quit: 11/03/2017  Substance and Sexual Activity   Alcohol use: No   Drug use: No   Sexual activity: Yes    Birth control/protection: Injection  Other Topics Concern   Not on file  Social History Narrative   Not on file   Social Determinants of Health    Financial Resource Strain: Not on file  Food Insecurity: Food Insecurity Present   Worried About Running Out of Food in the Last Year: Sometimes true   Ran Out of Food in the Last Year: Sometimes true  Transportation Needs: No Transportation Needs   Lack of Transportation (Medical): No   Lack of Transportation (Non-Medical): No  Physical Activity: Not on file  Stress: Not on file  Social Connections: Not on file  Intimate Partner Violence: Not on file     Outpatient Medications Prior to Visit  Medication Sig Dispense Refill   prazosin (MINIPRESS) 1 MG capsule Take 1 mg by mouth at bedtime.     sertraline (ZOLOFT) 50 MG tablet Take 50 mg by mouth daily. Take 1 1/2 tablet daily     ALPRAZolam (XANAX) 0.5 MG tablet Take 1 tablet (0.5 mg total) by mouth 2 (two) times daily. (Patient not taking: Reported on 04/29/2021) 60 tablet 0   azithromycin (ZITHROMAX) 250 MG tablet Take 1 tablet (250 mg total) by mouth daily. (Patient not taking: Reported on 10/28/2021) 4 tablet 0   loratadine-pseudoephedrine (CLARITIN-D 12 HOUR) 5-120 MG tablet Take 1 tablet by mouth 2 (two) times  daily. (Patient not taking: Reported on 10/28/2021) 20 tablet 0   omeprazole (PRILOSEC) 20 MG capsule Take 1 capsule (20 mg total) by mouth daily. (Patient not taking: Reported on 10/28/2021) 30 capsule 3   No facility-administered medications prior to visit.    Allergies  Allergen Reactions   Amoxicillin Rash    Allergy is from childhood; was taken to the hospital by her mother Has patient had a PCN reaction causing immediate rash, facial/tongue/throat swelling, SOB or lightheadedness with hypotension: Unknown Has patient had a PCN reaction causing severe rash involving mucus membranes or skin necrosis: No Has patient had a PCN reaction that required hospitalization: No Has patient had a PCN reaction occurring within the last 10 years: No If all of the above answers are "NO", then may proceed with Cephalos     ROS Review of Systems  Constitutional:  Negative for activity change and appetite change.  HENT: Negative.    Eyes: Negative.   Respiratory: Negative.    Cardiovascular: Negative.   Gastrointestinal: Negative.   Endocrine: Negative.   Genitourinary: Negative.   Musculoskeletal:  Positive for arthralgias and gait problem.  Skin: Negative.   Neurological:  Positive for dizziness and numbness. Negative for headaches.  Psychiatric/Behavioral:  Negative for agitation, behavioral problems and confusion.      Objective:    Physical Exam  BP 117/73    Pulse 70    Ht 5\' 5"  (1.651 m)    Wt 151 lb 3.2 oz (68.6 kg)    BMI 25.16 kg/m  Wt Readings from Last 3 Encounters:  10/28/21 151 lb 3.2 oz (68.6 kg)  10/12/21 148 lb (67.1 kg)  07/05/21 142 lb 12.8 oz (64.8 kg)     Health Maintenance Due  Topic Date Due   COVID-19 Vaccine (1) Never done   HPV VACCINES (1 - 2-dose series) Never done   Hepatitis C Screening  Never done   INFLUENZA VACCINE  Never done       Topic Date Due   HPV VACCINES (1 - 2-dose series) Never done    No results found for: TSH Lab Results  Component Value Date   WBC 9.4 07/01/2021   HGB 14.3 07/01/2021   HCT 42.6 07/01/2021   MCV 85.5 07/01/2021   PLT 280 07/01/2021   Lab Results  Component Value Date   NA 136 07/01/2021   K 3.7 07/01/2021   CO2 24 07/01/2021   GLUCOSE 87 07/01/2021   BUN 6 07/01/2021   CREATININE 0.53 07/01/2021   BILITOT 0.5 10/17/2018   ALKPHOS 44 10/17/2018   AST 18 10/17/2018   ALT 17 10/17/2018   PROT 7.7 10/17/2018   ALBUMIN 4.7 10/17/2018   CALCIUM 9.9 07/01/2021   ANIONGAP 12 07/01/2021   No results found for: CHOL No results found for: HDL No results found for: LDLCALC No results found for: TRIG No results found for: CHOLHDL No results found for: HGBA1C    Assessment & Plan:   Problem List Items Addressed This Visit       Cardiovascular and Mediastinum   Superficial vein thrombosis - Primary     Pt  positive for Homan's sign. Area of induration and tenderness behind the left knee. Symptomatic treatment with antiinflammatory. Started on ibuprofen 400 mg three times a day as needed for pain.  Advised pt to apply hot pack on the inflamed area.           Relevant Medications   prazosin (MINIPRESS) 1 MG capsule  Advised pt to call the office if symptoms does nor resolve or go to ER if symptoms increases.   Meds ordered this encounter  Medications   ibuprofen (ADVIL) 400 MG tablet    Sig: Take 1 tablet (400 mg total) by mouth 3 (three) times daily.    Dispense:  30 tablet    Refill:  0     Follow-up: Return in about 1 week (around 11/04/2021).   The pt was jointly seen by Theresia Lo, NP and Dr. Lavera Guise.    Theresia Lo, NP

## 2021-10-31 ENCOUNTER — Encounter: Payer: Self-pay | Admitting: Nurse Practitioner

## 2021-10-31 DIAGNOSIS — I82890 Acute embolism and thrombosis of other specified veins: Secondary | ICD-10-CM | POA: Insufficient documentation

## 2021-10-31 NOTE — Assessment & Plan Note (Signed)
Pt  positive for Homan's sign. Area of induration and tenderness behind the left knee. Symptomatic treatment with antiinflammatory. Started on ibuprofen 400 mg three times a day as needed for pain.  Advised pt to apply hot pack on the inflamed area.

## 2021-11-04 ENCOUNTER — Ambulatory Visit: Payer: Medicaid Other | Admitting: Nurse Practitioner

## 2021-11-04 NOTE — Progress Notes (Deleted)
? ?Established Patient Office Visit ? ?Subjective:  ?Patient ID: Karen Huang, female    DOB: 10/04/1995  Age: 26 y.o. MRN: [id number] ? ?CC: No chief complaint on file. ? ? ? ?HPI ? ?Fantashia Shupert presents for: ? ?HPI  ? ?Past Medical History:  ?Diagnosis Date  ? Allergies   ? Anxiety   ? Asthma   ? Depression   ? Headache   ? Hx of chlamydia infection   ? age 78  ? SVD (spontaneous vaginal delivery) 06/21/2018  ? ? ?Past Surgical History:  ?Procedure Laterality Date  ? TONSILLECTOMY    ? ? ?Family History  ?Problem Relation Age of Onset  ? Hypertension Father   ? Heart Problems Father   ? Hypertension Sister   ? Hypertension Paternal Grandmother   ? COPD Paternal Grandmother   ? Diabetes Paternal Grandmother   ? Heart Problems Paternal Grandmother   ? Alcohol abuse Mother   ? ADD / ADHD Brother   ? COPD Maternal Grandmother   ? ? ?Social History  ? ?Socioeconomic History  ? Marital status: Single  ?  Spouse name: Not on file  ? Number of children: Not on file  ? Years of education: Not on file  ? Highest education level: Not on file  ?Occupational History  ? Not on file  ?Tobacco Use  ? Smoking status: Some Days  ?  Types: E-cigarettes  ?  Last attempt to quit: 11/17/2017  ?  Years since quitting: 3.9  ? Smokeless tobacco: Never  ?Vaping Use  ? Vaping Use: Some days  ? Last attempt to quit: 11/03/2017  ?Substance and Sexual Activity  ? Alcohol use: No  ? Drug use: No  ? Sexual activity: Yes  ?  Birth control/protection: Injection  ?Other Topics Concern  ? Not on file  ?Social History Narrative  ? Not on file  ? ?Social Determinants of Health  ? ?Financial Resource Strain: Not on file  ?Food Insecurity: Food Insecurity Present  ? Worried About Programme researcher, broadcasting/film/video in the Last Year: Sometimes true  ? Ran Out of Food in the Last Year: Sometimes true  ?Transportation Needs: No Transportation Needs  ? Lack of Transportation (Medical): No  ? Lack of Transportation (Non-Medical): No  ?Physical Activity: Not on file   ?Stress: Not on file  ?Social Connections: Not on file  ?Intimate Partner Violence: Not on file  ? ? ? ?Outpatient Medications Prior to Visit  ?Medication Sig Dispense Refill  ? ibuprofen (ADVIL) 400 MG tablet Take 1 tablet (400 mg total) by mouth 3 (three) times daily. 30 tablet 0  ? prazosin (MINIPRESS) 1 MG capsule Take 1 mg by mouth at bedtime.    ? sertraline (ZOLOFT) 50 MG tablet Take 50 mg by mouth daily. Take 1 1/2 tablet daily    ? ?No facility-administered medications prior to visit.  ? ? ?Allergies  ?Allergen Reactions  ? Amoxicillin Rash  ?  Allergy is from childhood; was taken to the hospital by her mother ?Has patient had a PCN reaction causing immediate rash, facial/tongue/throat swelling, SOB or lightheadedness with hypotension: Unknown ?Has patient had a PCN reaction causing severe rash involving mucus membranes or skin necrosis: No ?Has patient had a PCN reaction that required hospitalization: No ?Has patient had a PCN reaction occurring within the last 10 years: No ?If all of the above answers are "NO", then may proceed with Cephalos  ? ? ?ROS ?Review of Systems ? ?  ?Objective:  ?  ?  Physical Exam ? ?There were no vitals taken for this visit. ?Wt Readings from Last 3 Encounters:  ?10/28/21 151 lb 3.2 oz (68.6 kg)  ?10/12/21 148 lb (67.1 kg)  ?07/05/21 142 lb 12.8 oz (64.8 kg)  ? ? ? ?Health Maintenance Due  ?Topic Date Due  ? COVID-19 Vaccine (1) Never done  ? HPV VACCINES (1 - 2-dose series) Never done  ? Hepatitis C Screening  Never done  ? INFLUENZA VACCINE  Never done  ? ? ?   ?Topic Date Due  ? HPV VACCINES (1 - 2-dose series) Never done  ? ? ?No results found for: TSH ?Lab Results  ?Component Value Date  ? WBC 9.4 07/01/2021  ? HGB 14.3 07/01/2021  ? HCT 42.6 07/01/2021  ? MCV 85.5 07/01/2021  ? PLT 280 07/01/2021  ? ?Lab Results  ?Component Value Date  ? NA 136 07/01/2021  ? K 3.7 07/01/2021  ? CO2 24 07/01/2021  ? GLUCOSE 87 07/01/2021  ? BUN 6 07/01/2021  ? CREATININE 0.53 07/01/2021  ?  BILITOT 0.5 10/17/2018  ? ALKPHOS 44 10/17/2018  ? AST 18 10/17/2018  ? ALT 17 10/17/2018  ? PROT 7.7 10/17/2018  ? ALBUMIN 4.7 10/17/2018  ? CALCIUM 9.9 07/01/2021  ? ANIONGAP 12 07/01/2021  ? ?No results found for: CHOL ?No results found for: HDL ?No results found for: LDLCALC ?No results found for: TRIG ?No results found for: CHOLHDL ?No results found for: HGBA1C ? ?  ?Assessment & Plan:  ? ?Problem List Items Addressed This Visit   ?None ? ? ? ?No orders of the defined types were placed in this encounter. ? ? ? ?Follow-up: No follow-ups on file.  ? ? ?Kara Dies, NP ?

## 2021-11-10 ENCOUNTER — Other Ambulatory Visit: Payer: Self-pay

## 2021-11-10 ENCOUNTER — Encounter (HOSPITAL_COMMUNITY): Payer: Self-pay

## 2021-11-10 ENCOUNTER — Emergency Department (HOSPITAL_COMMUNITY)
Admission: EM | Admit: 2021-11-10 | Discharge: 2021-11-10 | Disposition: A | Discharge status: Home / Self Care | Payer: Medicaid Other | Attending: Emergency Medicine | Admitting: Emergency Medicine

## 2021-11-10 DIAGNOSIS — R079 Chest pain, unspecified: Secondary | ICD-10-CM | POA: Diagnosis not present

## 2021-11-10 DIAGNOSIS — Z5321 Procedure and treatment not carried out due to patient leaving prior to being seen by health care provider: Secondary | ICD-10-CM | POA: Diagnosis not present

## 2021-11-10 DIAGNOSIS — R0602 Shortness of breath: Secondary | ICD-10-CM | POA: Insufficient documentation

## 2021-11-10 DIAGNOSIS — I82612 Acute embolism and thrombosis of superficial veins of left upper extremity: Secondary | ICD-10-CM | POA: Diagnosis not present

## 2021-11-10 DIAGNOSIS — M79605 Pain in left leg: Secondary | ICD-10-CM | POA: Insufficient documentation

## 2021-11-10 NOTE — ED Notes (Signed)
PT called x3 for room, LWBS.  ?

## 2021-11-10 NOTE — ED Triage Notes (Signed)
Pt reports she is here today due to left leg pain. Pt reports there is a knot on the back side of the left leg. Pt reports she was dx with superficial thrombosis. Pt reports she was dx a week & half ago.   ?

## 2021-11-10 NOTE — ED Provider Triage Note (Signed)
Emergency Medicine Provider Triage Evaluation Note ? ?Karen Huang , a 26 y.o. female  was evaluated in triage.  Pt complains of dx supervicial venous thrombosis of left leg 1.5 weeks ago. Went into PCP for recheck, was told it is doing alright. Worse pain, slightly worse swelling today. Patient reports she feels somewhat short of breath, occasional sharp chest pain. No hemoptysis. No hx DVT, PE. ? ?Review of Systems  ?Positive: Superficial venous thrombosis, leg pain, chest pain, shob ?Negative: hemoptysis ? ?Physical Exam  ?BP 121/78 (BP Location: Right Arm)   Pulse 77   Temp 98.3 ?F (36.8 ?C) (Oral)   Resp 18   SpO2 97%  ?Gen:   Awake, no distress   ?Resp:  Normal effort  ?MSK:   Moves extremities without difficulty  ?Other:  Clear breath sounds throughout, palpable thrombosis left calf ? ?Medical Decision Making  ?Medically screening exam initiated at 8:21 PM.  Appropriate orders placed.  Jeree Delcid was informed that the remainder of the evaluation will be completed by another provider, this initial triage assessment does not replace that evaluation, and the importance of remaining in the ED until their evaluation is complete. ? ?Concern for worsening superficial venous thrombosis, shob ?Workup initiated ?  ?Olene Floss, PA-C ?11/10/21 2024 ? ?

## 2021-11-12 ENCOUNTER — Ambulatory Visit: Payer: Medicaid Other | Admitting: Nurse Practitioner

## 2021-12-14 IMAGING — CT CT HIP*L* W/O CM
1 series · 16 of 32 positions shown, 20 images · non-contrast
Comparison: None.

CLINICAL DATA: Severe left hip pain since fall 2 days ago.

EXAM:
CT OF THE LEFT HIP WITHOUT CONTRAST
TECHNIQUE: Multidetector CT imaging of the left hip was performed according to
the standard protocol. Multiplanar CT image reconstructions were
also generated.

[Series 9: axial soft tissue · axial · 0.44mm/px · z∈[+800,+1030]mm · 16 of 129 slices shown, 20 images]
[im 9/129  soft-tissue]
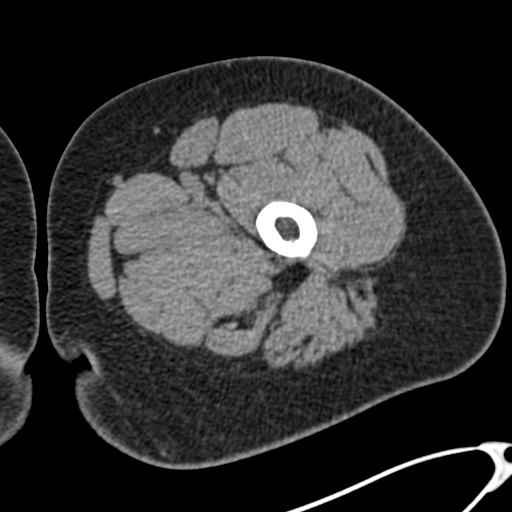
[im 9/129  bone]
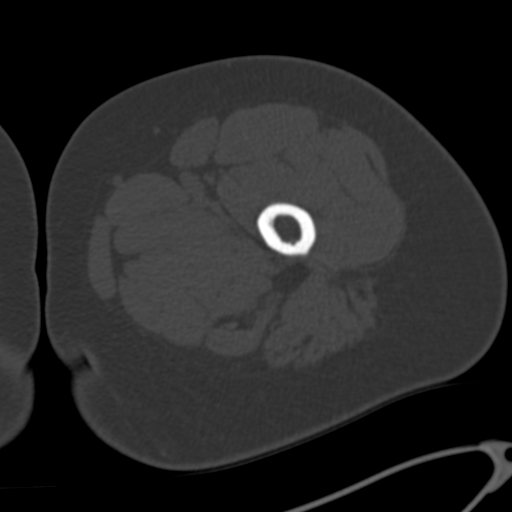
[im 17/129  soft-tissue]
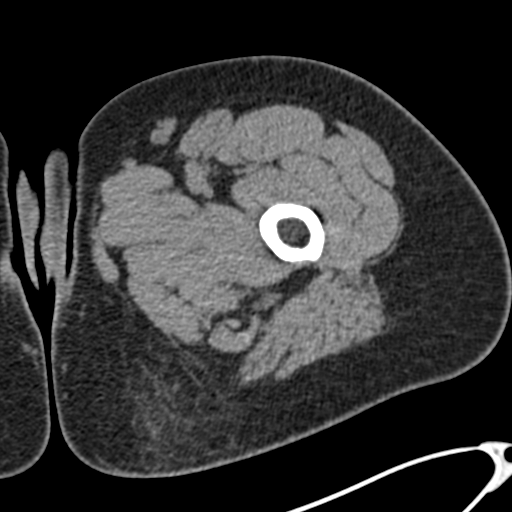
[im 25/129  soft-tissue]
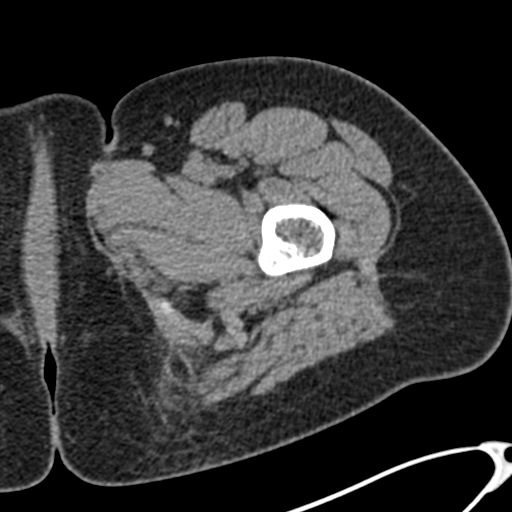
[im 34/129  soft-tissue]
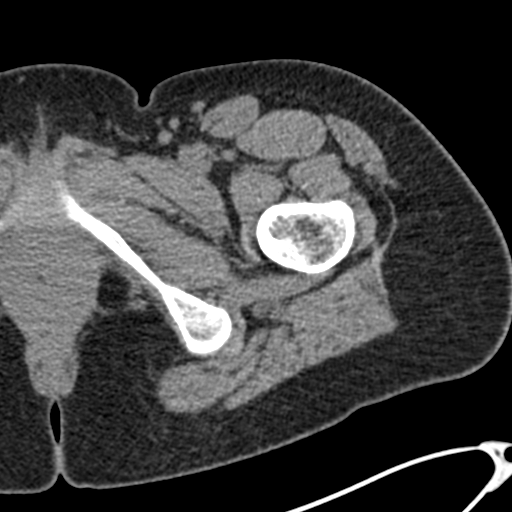
[im 42/129  soft-tissue]
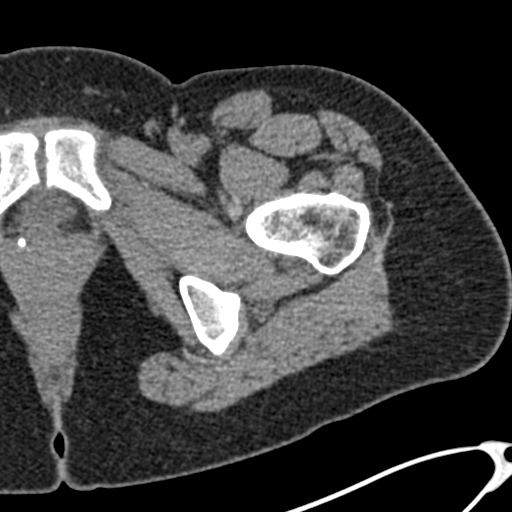
[im 50/129  soft-tissue]
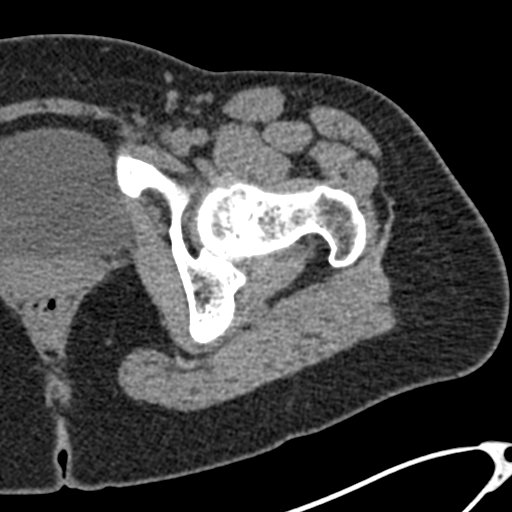
[im 58/129  soft-tissue]
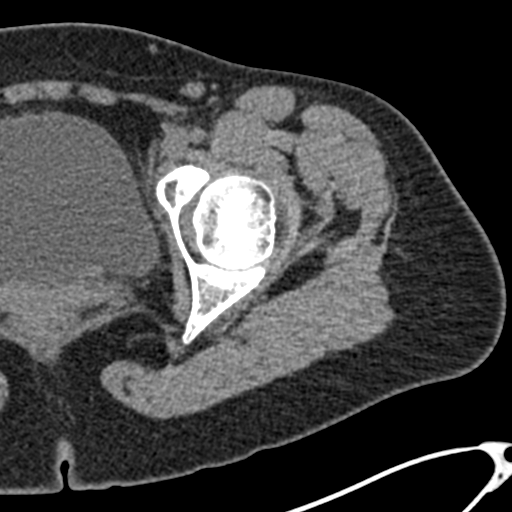
[im 71/129  soft-tissue]
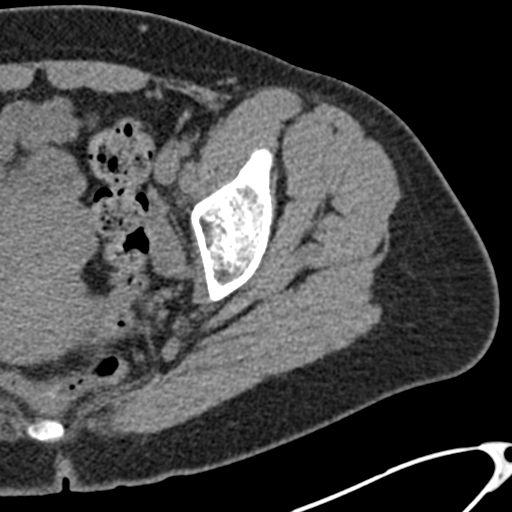
[im 79/129  soft-tissue]
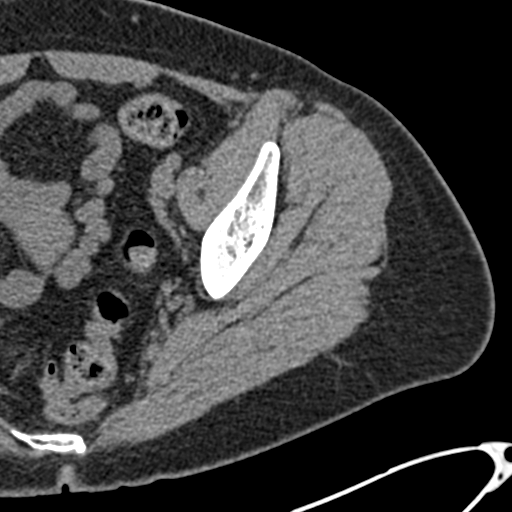
[im 79/129  bone]
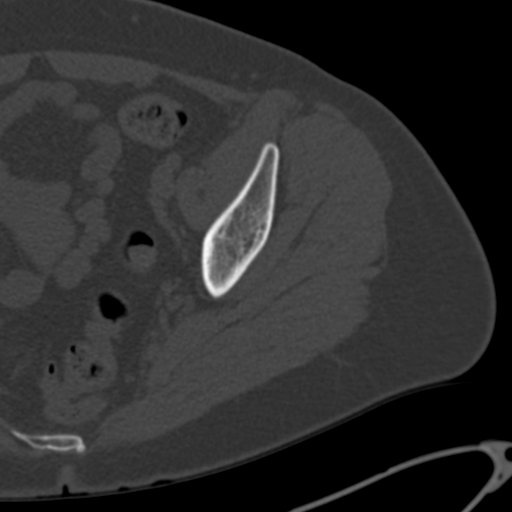
[im 87/129  soft-tissue]
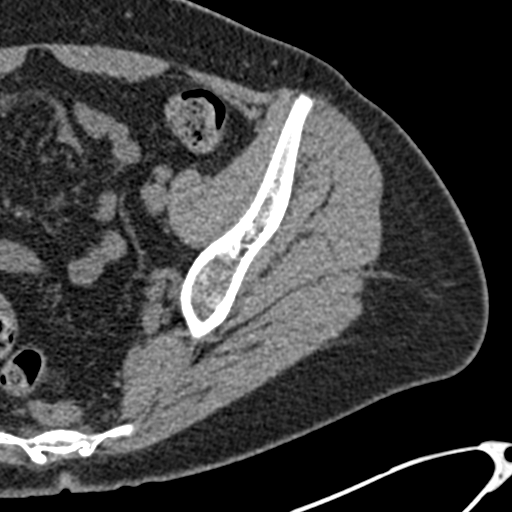
[im 95/129  soft-tissue]
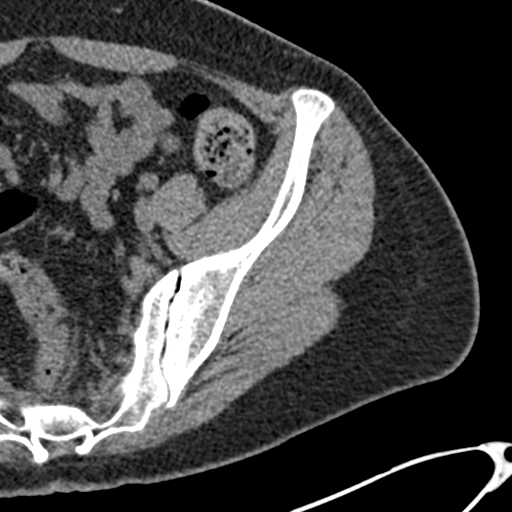
[im 104/129  soft-tissue]
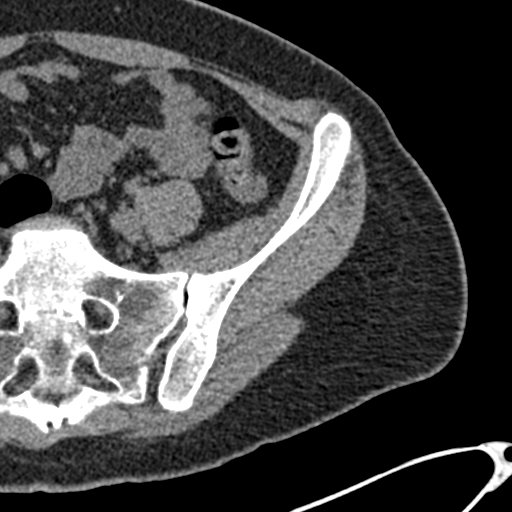
[im 112/129  soft-tissue]
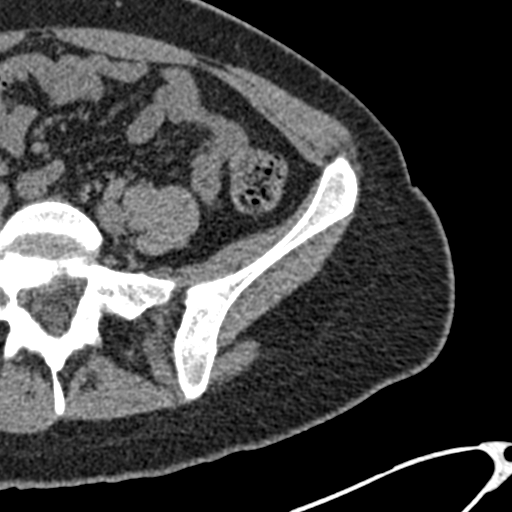
[im 112/129  lung]
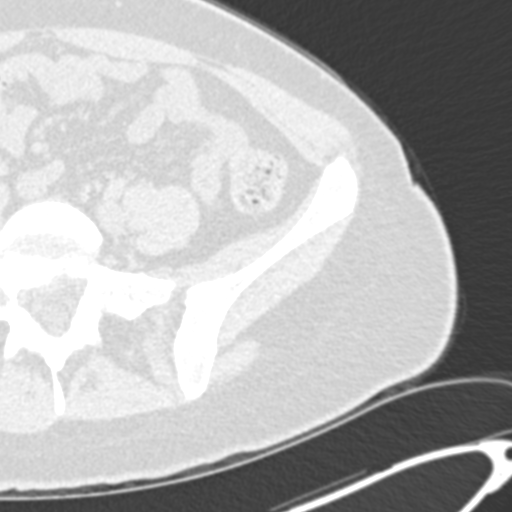
[im 116/129  lung]
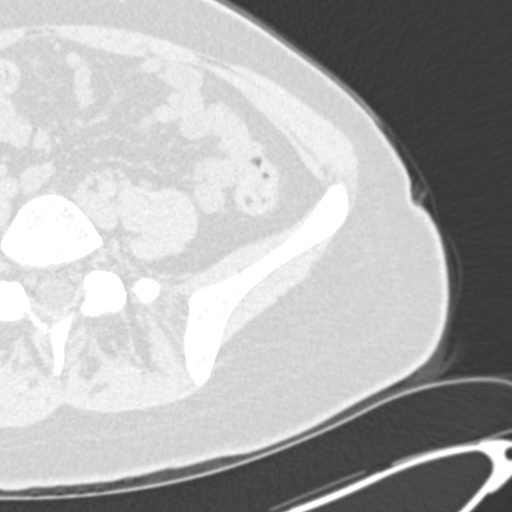
[im 120/129  soft-tissue]
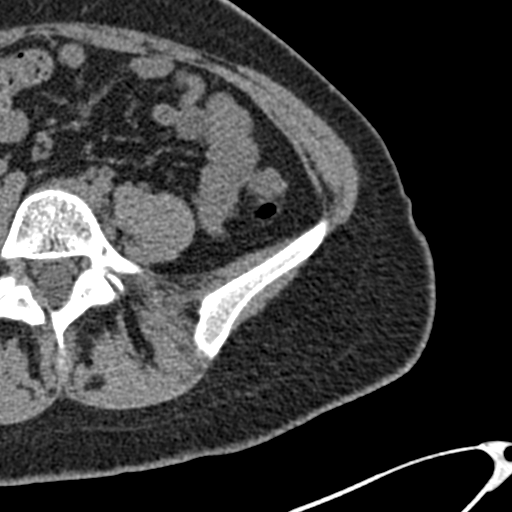
[im 120/129  lung]
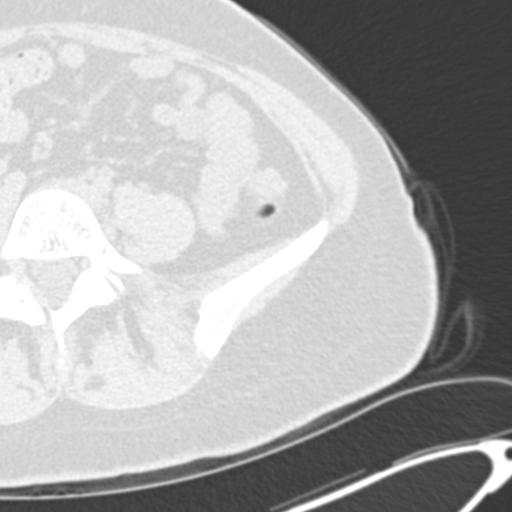
[im 124/129  lung]
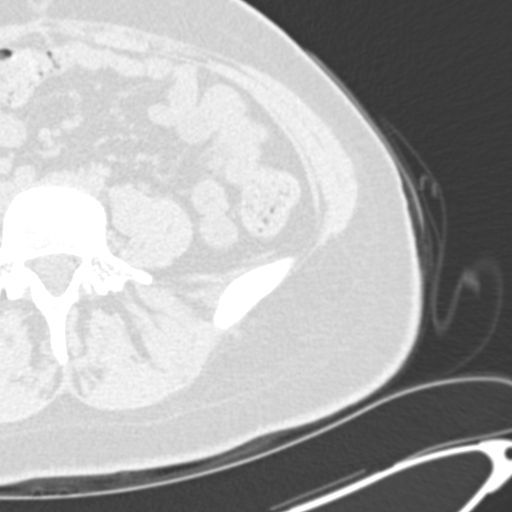

[16 of 32 positions shown; findings below may reference images not displayed]

FINDINGS: Bones/Joint/Cartilage

No fracture or dislocation. Joint spaces are preserved. No joint
effusion.

Ligaments

Ligaments are suboptimally evaluated by CT.

Muscles and Tendons
Grossly intact.

Soft tissue
No fluid collection or hematoma.  No soft tissue mass.
IMPRESSION: 1. Normal CT of the left hip.  No acute osseous abnormality.

## 2022-02-28 IMAGING — US US EXTREM LOW VENOUS*L*
1 series · 14 of 24 positions shown · non-contrast
Comparison: None.

CLINICAL DATA: Left leg pain.

EXAM:
LEFT LOWER EXTREMITY VENOUS DOPPLER ULTRASOUND
TECHNIQUE: Gray-scale sonography with compression, as well as color and duplex
ultrasound, were performed to evaluate the deep venous system(s)
from the level of the common femoral vein through the popliteal and
proximal calf veins.

[Series 1: us venous img lower uni left (dvt) · portal-venous · 36 acquisitions, 14 frames shown]
[im 1/36]
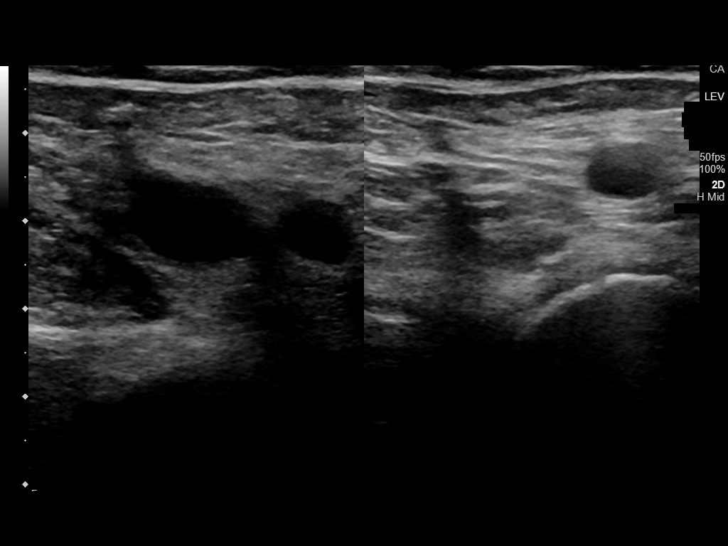
[im 4/36]
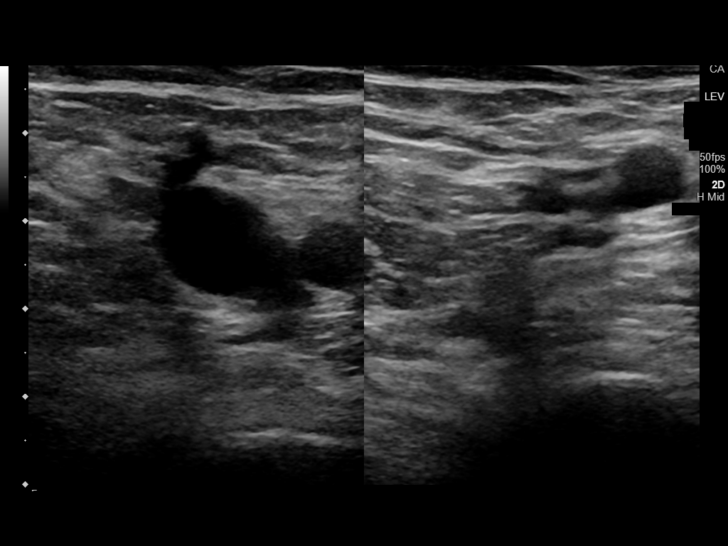
[im 7/36]
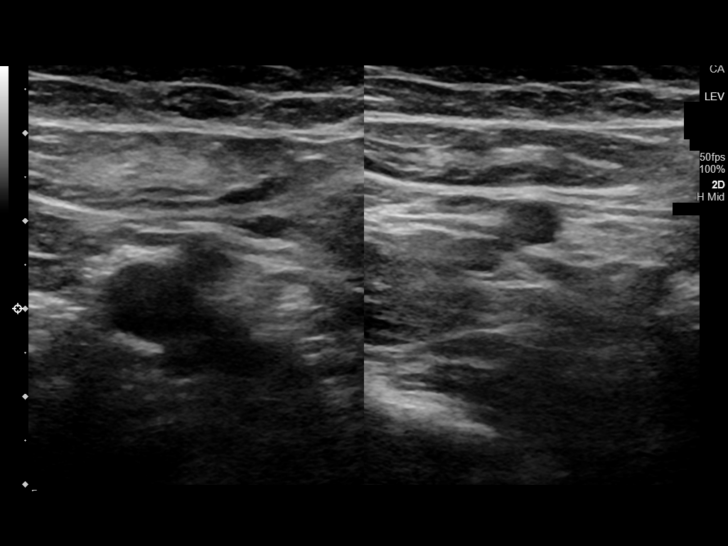
[im 10/36]
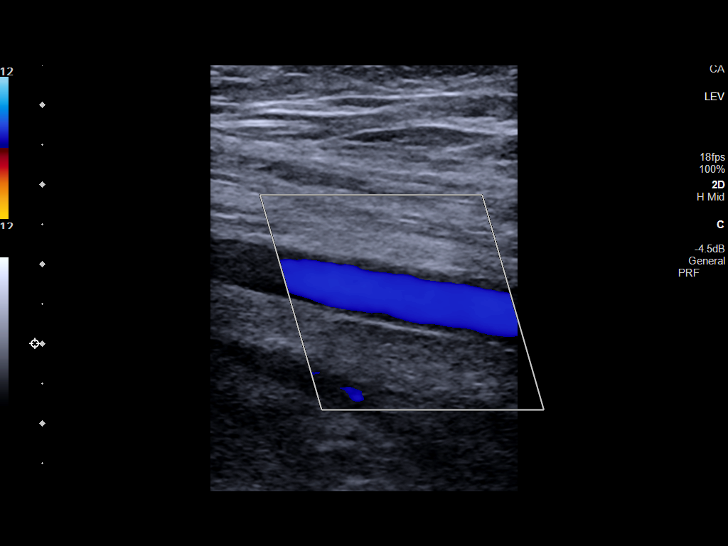
[im 11/36]
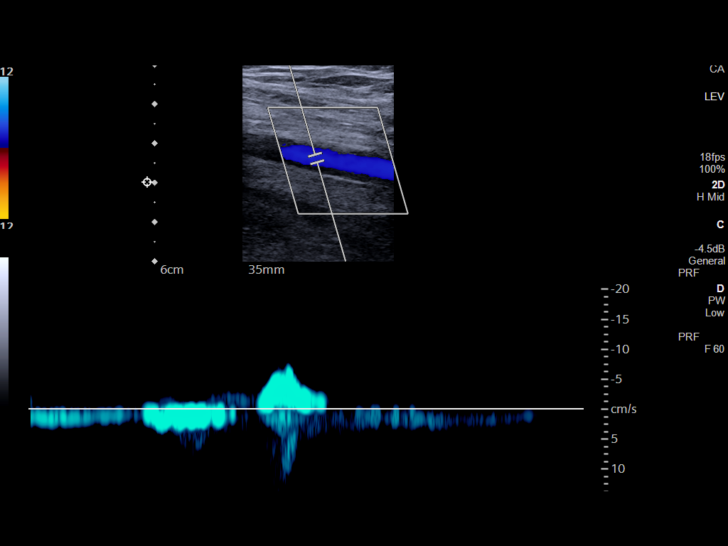
[im 14/36]
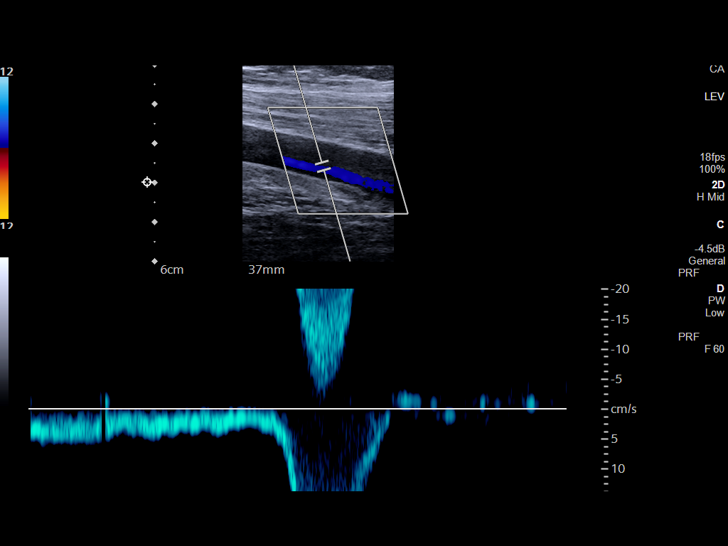
[im 17/36]
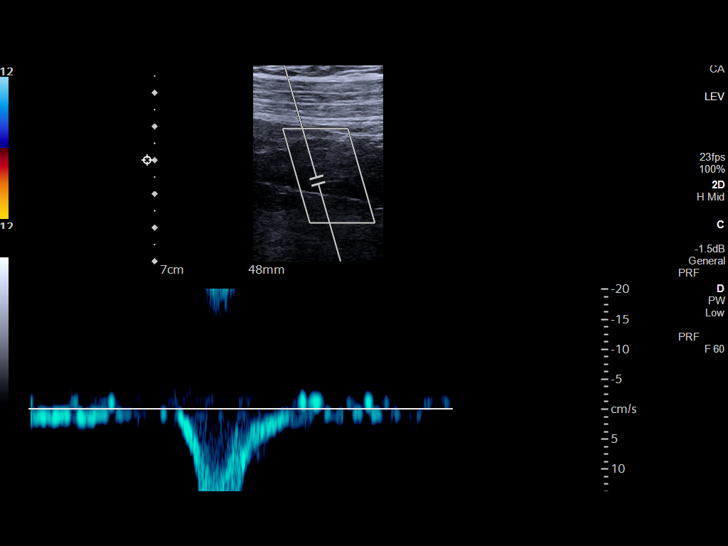
[im 20/36]
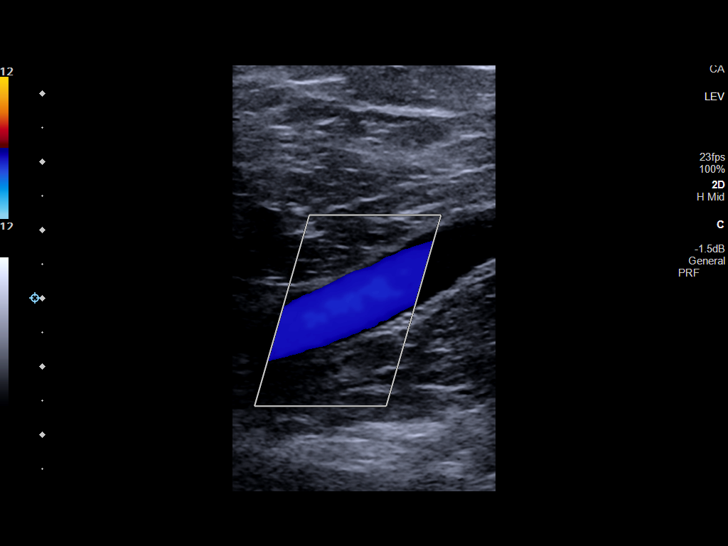
[im 23/36]
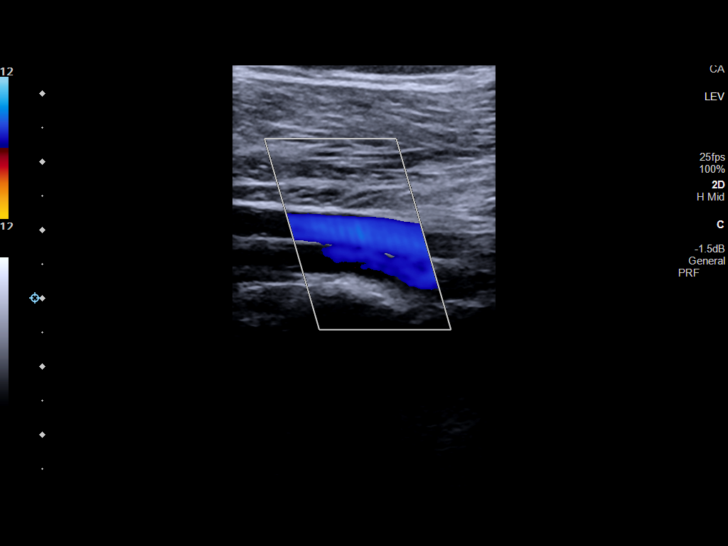
[im 26/36]
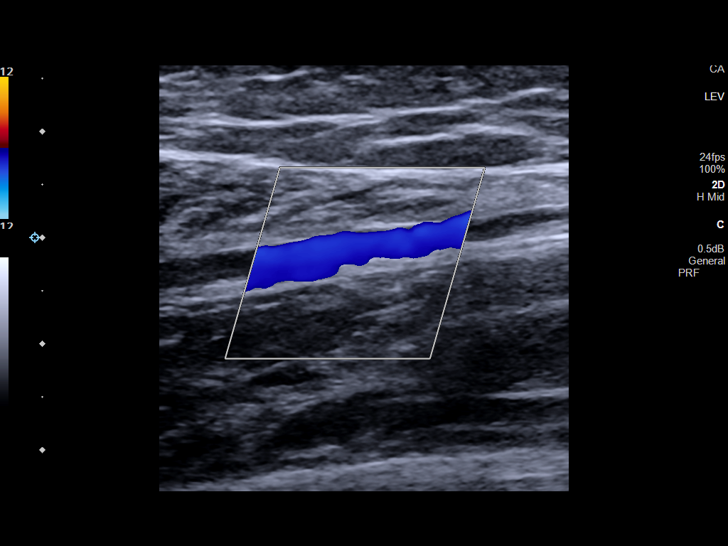
[im 29/36]
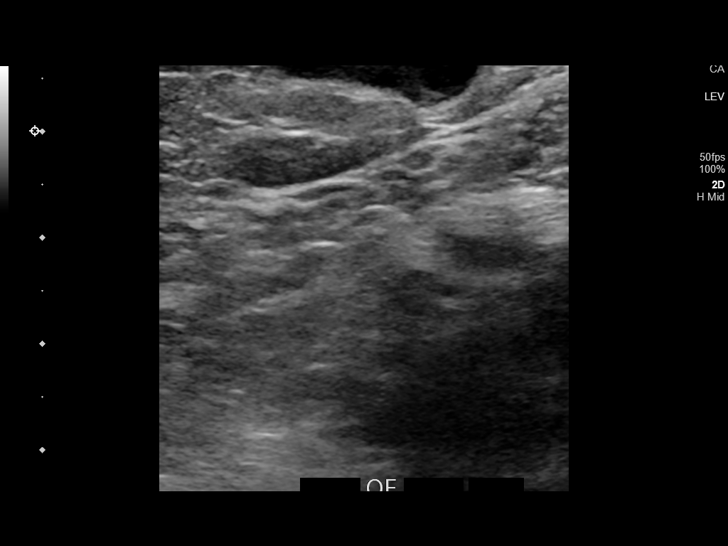
[im 31/36]
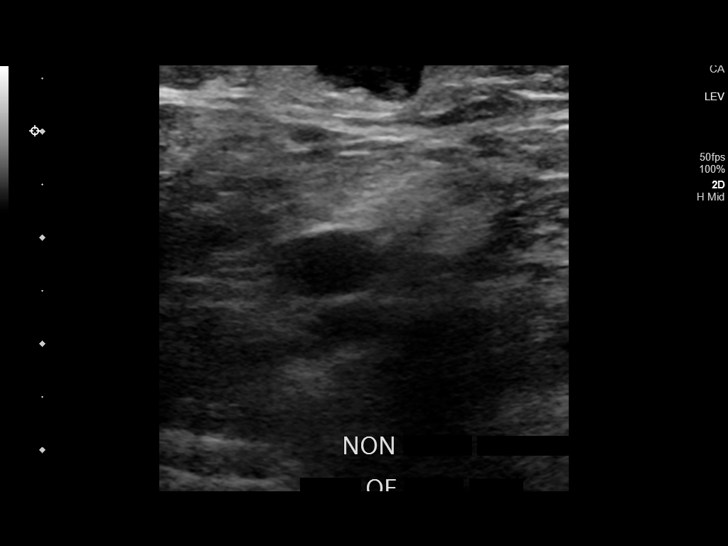
[im 32/36]
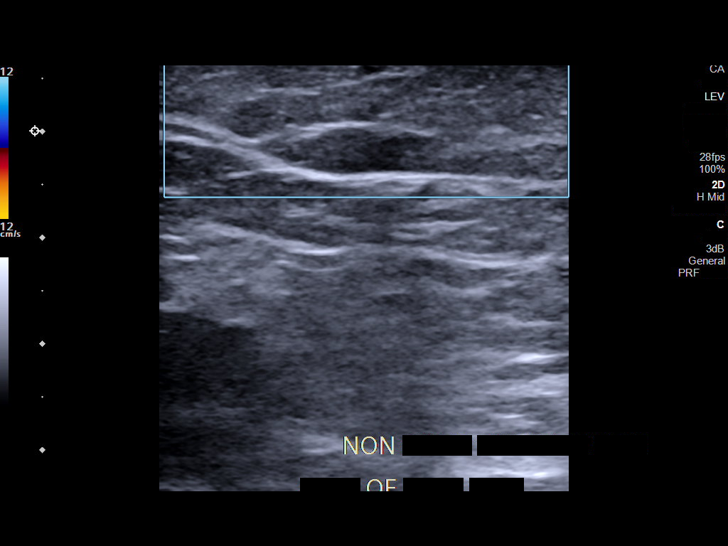
[im 36/36]
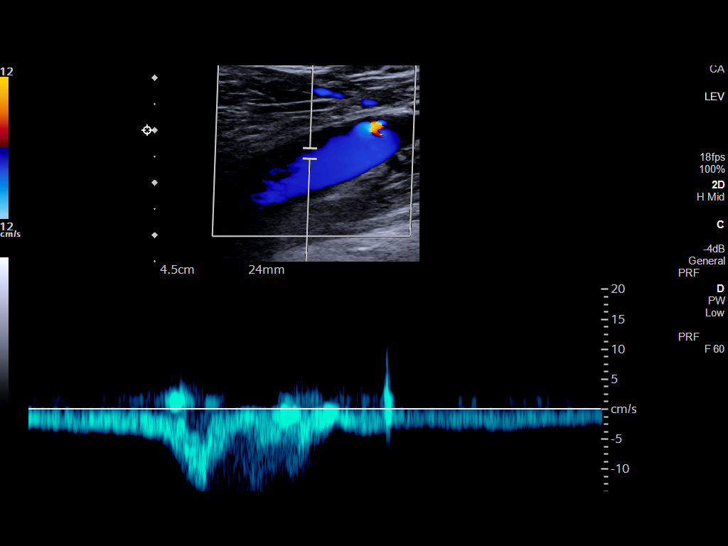

[14 of 24 positions shown; findings below may reference images not displayed]

FINDINGS: VENOUS

Normal compressibility of the common femoral, superficial femoral,
and popliteal veins, as well as the visualized calf veins.
Visualized portions of profunda femoral vein and great saphenous
vein unremarkable. No filling defects to suggest DVT on grayscale or
color Doppler imaging. Doppler waveforms show normal direction of
venous flow, normal respiratory plasticity and response to
augmentation.

Limited views of the contralateral common femoral vein are
unremarkable.

OTHER

In the area of pain/palpable abnormality behind the knee is a
thrombosed subcutaneous varicosity.

Limitations: none
IMPRESSION: 1. No evidence of left lower extremity DVT.
2. Thrombosed superficial varicosity behind the knee in the area of
patient reported pain.

## 2023-05-04 ENCOUNTER — Other Ambulatory Visit: Payer: Self-pay

## 2023-05-04 ENCOUNTER — Emergency Department
Admission: EM | Admit: 2023-05-04 | Discharge: 2023-05-04 | Disposition: A | Discharge status: Home / Self Care | Payer: BC Managed Care – PPO | Attending: Emergency Medicine | Admitting: Emergency Medicine

## 2023-05-04 ENCOUNTER — Emergency Department: Payer: BC Managed Care – PPO

## 2023-05-04 DIAGNOSIS — Z20822 Contact with and (suspected) exposure to covid-19: Secondary | ICD-10-CM | POA: Diagnosis not present

## 2023-05-04 DIAGNOSIS — J168 Pneumonia due to other specified infectious organisms: Secondary | ICD-10-CM | POA: Insufficient documentation

## 2023-05-04 DIAGNOSIS — J45909 Unspecified asthma, uncomplicated: Secondary | ICD-10-CM | POA: Diagnosis not present

## 2023-05-04 DIAGNOSIS — J189 Pneumonia, unspecified organism: Secondary | ICD-10-CM

## 2023-05-04 DIAGNOSIS — R509 Fever, unspecified: Secondary | ICD-10-CM | POA: Diagnosis present

## 2023-05-04 LAB — COMPREHENSIVE METABOLIC PANEL
ALT: 18 U/L (ref 0–44)
AST: 21 U/L (ref 15–41)
Albumin: 4.2 g/dL (ref 3.5–5.0)
Alkaline Phosphatase: 43 U/L (ref 38–126)
Anion gap: 10 (ref 5–15)
BUN: 13 mg/dL (ref 6–20)
CO2: 22 mmol/L (ref 22–32)
Calcium: 9.1 mg/dL (ref 8.9–10.3)
Chloride: 102 mmol/L (ref 98–111)
Creatinine, Ser: 0.7 mg/dL (ref 0.44–1.00)
GFR, Estimated: >60 mL/min (ref >60)
Glucose, Bld: 95 mg/dL (ref 70–99)
Potassium: 3.9 mmol/L (ref 3.5–5.1)
Sodium: 134 mmol/L — ABNORMAL LOW (ref 135–145)
Total Bilirubin: 0.3 mg/dL (ref 0.3–1.2)
Total Protein: 7.7 g/dL (ref 6.5–8.1)

## 2023-05-04 LAB — CBC WITH DIFFERENTIAL/PLATELET
Abs Immature Granulocytes: 0.01 10*3/uL (ref 0.00–0.07)
Basophils Absolute: 0 10*3/uL (ref 0.0–0.1)
Basophils Relative: 0 %
Eosinophils Absolute: 0 10*3/uL (ref 0.0–0.5)
Eosinophils Relative: 0 %
HCT: 40.2 % (ref 36.0–46.0)
Hemoglobin: 13.8 g/dL (ref 12.0–15.0)
Immature Granulocytes: 0 %
Lymphocytes Relative: 9 %
Lymphs Abs: 0.4 10*3/uL — ABNORMAL LOW (ref 0.7–4.0)
MCH: 29.9 pg (ref 26.0–34.0)
MCHC: 34.3 g/dL (ref 30.0–36.0)
MCV: 87 fL (ref 80.0–100.0)
Monocytes Absolute: 0.3 10*3/uL (ref 0.1–1.0)
Monocytes Relative: 6 %
Neutro Abs: 3.8 10*3/uL (ref 1.7–7.7)
Neutrophils Relative %: 85 %
Platelets: 192 10*3/uL (ref 150–400)
RBC: 4.62 MIL/uL (ref 3.87–5.11)
RDW: 12.4 % (ref 11.5–15.5)
WBC: 4.5 10*3/uL (ref 4.0–10.5)
nRBC: 0 % (ref 0.0–0.2)

## 2023-05-04 LAB — LACTIC ACID, PLASMA: Lactic Acid, Venous: 0.9 mmol/L (ref 0.5–1.9)

## 2023-05-04 LAB — PROCALCITONIN: Procalcitonin: 0.12 ng/mL

## 2023-05-04 LAB — HCG, QUANTITATIVE, PREGNANCY: hCG, Beta Chain, Quant, S: <1 m[IU]/mL (ref <5)

## 2023-05-04 LAB — RESP PANEL BY RT-PCR (RSV, FLU A&B, COVID)  RVPGX2
Influenza A by PCR: NEGATIVE
Influenza B by PCR: NEGATIVE
Resp Syncytial Virus by PCR: NEGATIVE
SARS Coronavirus 2 by RT PCR: NEGATIVE

## 2023-05-04 LAB — TROPONIN I (HIGH SENSITIVITY): Troponin I (High Sensitivity): 2 ng/L (ref <18)

## 2023-05-04 MED ORDER — SODIUM CHLORIDE 0.9 % IV SOLN
500.0000 mg | Freq: Once | INTRAVENOUS | Status: AC
  Administered 2023-05-04: 500 mg via INTRAVENOUS
  Filled 2023-05-04: qty 5

## 2023-05-04 MED ORDER — AZITHROMYCIN 250 MG PO TABS: 250.0000 mg | ORAL_TABLET | Freq: Every day | ORAL | 0 refills | Status: AC

## 2023-05-04 MED ORDER — KETOROLAC TROMETHAMINE 30 MG/ML IJ SOLN
30.0000 mg | Freq: Once | INTRAMUSCULAR | Status: AC
  Administered 2023-05-04: 30 mg via INTRAVENOUS
  Filled 2023-05-04: qty 1

## 2023-05-04 MED ORDER — CEFTRIAXONE SODIUM 2 G IJ SOLR
2.0000 g | Freq: Once | INTRAMUSCULAR | Status: AC
Start: 2023-05-04 — End: 2023-05-04
  Administered 2023-05-04: 2 g via INTRAVENOUS
  Filled 2023-05-04: qty 20

## 2023-05-04 MED ORDER — LORAZEPAM 2 MG/ML IJ SOLN
1.0000 mg | Freq: Once | INTRAMUSCULAR | Status: AC
  Administered 2023-05-04: 1 mg via INTRAVENOUS
  Filled 2023-05-04: qty 1

## 2023-05-04 MED ORDER — SODIUM CHLORIDE 0.9 % IV BOLUS (SEPSIS)
1000.0000 mL | Freq: Once | INTRAVENOUS | Status: AC
  Administered 2023-05-04: 1000 mL via INTRAVENOUS

## 2023-05-04 MED ORDER — ACETAMINOPHEN 500 MG PO TABS
1000.0000 mg | ORAL_TABLET | Freq: Once | ORAL | Status: AC
  Administered 2023-05-04: 1000 mg via ORAL
  Filled 2023-05-04: qty 2

## 2023-05-04 MED ORDER — DROPERIDOL 2.5 MG/ML IJ SOLN
2.5000 mg | Freq: Once | INTRAMUSCULAR | Status: AC
  Administered 2023-05-04: 2.5 mg via INTRAVENOUS
  Filled 2023-05-04: qty 2

## 2023-05-04 NOTE — ED Triage Notes (Signed)
Pt reports last dose of OTC med was Motrin at ~2030

## 2023-05-04 NOTE — Progress Notes (Signed)
CODE SEPSIS - PHARMACY COMMUNICATION  **Broad Spectrum Antibiotics should be administered within 1 hour of Sepsis diagnosis**  Time Code Sepsis Called/Page Received: 8/29 @ 1610  Antibiotics Ordered:  Ceftriaxone 2 gm   Time of 1st antibiotic administration: Ceftriaxone 2 gm IV X 1 on 8/29 @ 0627   Additional action taken by pharmacy:   If necessary, Name of Provider/Nurse Contacted:     Deontray Hunnicutt D ,PharmD Clinical Pharmacist  05/04/2023  6:41 AM

## 2023-05-04 NOTE — ED Provider Notes (Signed)
[facility] Provider Note    Event Date/Time   First MD Initiated Contact with Patient 05/04/23 [phone]     (approximate)   History   flu symptoms   HPI  Karen Huang is a 27 y.o. female with history of asthma, anxiety who presents to the emergency department with complaints of fevers, cough, chest pain that started initially several days ago and shortness of breath and now pain throughout her body, neck and head.  She denies any vomiting or diarrhea.  No dysuria or hematuria.  No tick bites.  No sick contacts or recent travel.  States she is trying not to cough because it worsens the pain throughout her body.  Patient took ibuprofen at 10:30 PM last night.   She has had some abnormal vaginal bleeding and is spotting for the past 2 days.  No discharge.  She has experienced pelvic pain that has been ongoing since last pregnancy in 2019.  History provided by patient.    Past Medical History:  Diagnosis Date   Allergies    Anxiety    Asthma    Depression    Headache    Hx of chlamydia infection    age 14   SVD (spontaneous vaginal delivery) 06/21/2018    Past Surgical History:  Procedure Laterality Date   TONSILLECTOMY      MEDICATIONS:  Prior to Admission medications   Medication Sig Start Date End Date Taking? Authorizing Provider  ibuprofen (ADVIL) 400 MG tablet Take 1 tablet (400 mg total) by mouth 3 (three) times daily. 10/28/21   Kara Dies, NP  prazosin (MINIPRESS) 1 MG capsule Take 1 mg by mouth at bedtime. 10/22/21   [provider]  sertraline (ZOLOFT) 50 MG tablet Take 50 mg by mouth daily. Take 1 1/2 tablet daily    [provider]    Physical Exam   Triage Vital Signs: ED Triage Vitals  Encounter Vitals Group     BP 05/04/23 0532 125/82     Systolic BP Percentile --      Diastolic BP Percentile --      Pulse Rate 05/04/23 0532 (!) 113     Resp 05/04/23 0532 20     Temp 05/04/23 0534 (!) 101.8 F  (38.8 C)     Temp Source 05/04/23 0532 Oral     SpO2 05/04/23 0532 95 %     Weight 05/04/23 0533 135 lb (61.2 kg)     Height 05/04/23 0533 5\' 6"  (1.676 m)     Head Circumference --      Peak Flow --      Pain Score 05/04/23 0533 8     Pain Loc --      Pain Education --      Exclude from Growth Chart --     Most recent vital signs: Vitals:   05/04/23 0534 05/04/23 0600  BP:  117/78  Pulse:  (!) 102  Resp:    Temp: (!) 101.8 F (38.8 C)   SpO2:  98%    CONSTITUTIONAL: Alert, responds appropriately to questions.  Tearful, appears uncomfortable, nontoxic HEAD: Normocephalic, atraumatic EYES: Conjunctivae clear, pupils appear equal, sclera nonicteric ENT: normal nose; moist mucous membranes, no tonsillar hypertrophy or exudate, no uvular deviation, no trismus or drooling, normal phonation NECK: Supple, normal ROM, no meningismus CARD: Regular and tachycardic; S1 and S2 appreciated RESP: Normal chest excursion without splinting or tachypnea; breath sounds clear and equal bilaterally; no wheezes, no rhonchi, no  rales, no hypoxia or respiratory distress, speaking full sentences ABD/GI: Non-distended; soft, non-tender, no rebound, no guarding, no peritoneal signs BACK: The back appears normal EXT: Normal ROM in all joints; no deformity noted, no edema SKIN: Normal color for age and race; warm; no rash on exposed skin NEURO: Moves all extremities equally, normal speech PSYCH: The patient's mood and manner are appropriate.   ED Results / Procedures / Treatments   LABS: (all labs ordered are listed, but only abnormal results are displayed) Labs Reviewed  CBC WITH DIFFERENTIAL/PLATELET - Abnormal; Notable for the following components:      Result Value   Lymphs Abs 0.4 (*)    All other components within normal limits  COMPREHENSIVE METABOLIC PANEL - Abnormal; Notable for the following components:   Sodium 134 (*)    All other components within normal limits  RESP PANEL BY  RT-PCR (RSV, FLU A&B, COVID)  RVPGX2  CULTURE, BLOOD (ROUTINE X 2)  CULTURE, BLOOD (ROUTINE X 2)  LACTIC ACID, PLASMA  PROCALCITONIN  URINALYSIS, ROUTINE W REFLEX MICROSCOPIC  HCG, QUANTITATIVE, PREGNANCY  TROPONIN I (HIGH SENSITIVITY)     EKG:  EKG Interpretation Date/Time:  Thursday May 04 2023 06:14:57 EDT Ventricular Rate:  102 PR Interval:  154 QRS Duration:  106 QT Interval:  335 QTC Calculation: 437 R Axis:   106  Text Interpretation: Sinus tachycardia Borderline right axis deviation Borderline T abnormalities, anterior leads Confirmed by Rochele Raring [phone]) on 05/04/2023 6:25:17 AM         RADIOLOGY: My personal review and interpretation of imaging: Chest x-ray shows left lower lobe pneumonia.  I have personally reviewed all radiology reports.   DG Chest Portable 1 View  Result Date: 05/04/2023 CLINICAL DATA:  27 year old female with fever, headache, body ache, neck pain. Negative for COVID-19. Yesterday. EXAM: PORTABLE CHEST 1 VIEW COMPARISON:  Chest radiographs 07/01/2021. FINDINGS: Portable AP upright view at 0549 hours. Confluent abnormal left lower lung opacity, retrocardiac. Lung volumes remain normal. Mediastinal contours remain normal. Visualized tracheal air column is within normal limits. Elsewhere lung markings are stable. Incidental right nipple shadow. No pneumothorax or pleural effusion. No osseous abnormality identified. Negative visible bowel gas. IMPRESSION: Positive for Left lower lobe Pneumonia. No evidence of pleural effusion. Electronically Signed   By: Odessa Fleming M.D.   On: 05/04/2023 06:03     PROCEDURES:  Critical Care performed: Yes, see critical care procedure note(s)   CRITICAL CARE Performed by: Baxter Hire Carrell Rahmani   Total critical care time: 40 minutes  Critical care time was exclusive of separately billable procedures and treating other patients.  Critical care was necessary to treat or prevent imminent or life-threatening  deterioration.  Critical care was time spent personally by me on the following activities: development of treatment plan with patient and/or surrogate as well as nursing, discussions with consultants, evaluation of patient's response to treatment, examination of patient, obtaining history from patient or surrogate, ordering and performing treatments and interventions, ordering and review of laboratory studies, ordering and review of radiographic studies, pulse oximetry and re-evaluation of patient's condition.   Marland Kitchen1-3 Lead EKG Interpretation  Performed by: Rachel Rison, Layla Maw, DO Authorized by: Ariadna Setter, Layla Maw, DO     Interpretation: abnormal     ECG rate:  105   ECG rate assessment: tachycardic     Rhythm: sinus tachycardia     Ectopy: none     Conduction: normal       IMPRESSION / MDM / ASSESSMENT AND PLAN /  ED COURSE  I reviewed the triage vital signs and the nursing notes.    Patient here with fever, tachycardia, cough, chest pain shortness of breath, diffuse bodyaches.  The patient is on the cardiac monitor to evaluate for evidence of arrhythmia and/or significant heart rate changes.   DIFFERENTIAL DIAGNOSIS (includes but not limited to):   Viral URI, pneumonia, meningitis, sepsis, bacteremia, UTI   Patient's presentation is most consistent with acute presentation with potential threat to life or bodily function.   PLAN: Will obtain labs, urine, cultures, chest x-ray, COVID and flu swabs.  Will give IV fluids, Toradol, droperidol for symptomatic relief.  She is complaining of headache but also complaining of diffuse bodyaches.  No meningismus on exam.  I do not feel she needs a lumbar puncture at this time but will closely monitor.   MEDICATIONS GIVEN IN ED: Medications  azithromycin (ZITHROMAX) 500 mg in sodium chloride 0.9 % 250 mL IVPB (has no administration in time range)  sodium chloride 0.9 % bolus 1,000 mL (1,000 mLs Intravenous New Bag/Given 05/04/23 0705)  ketorolac  (TORADOL) 30 MG/ML injection 30 mg (30 mg Intravenous Given 05/04/23 0623)  sodium chloride 0.9 % bolus 1,000 mL (0 mLs Intravenous Stopped 05/04/23 0705)  droperidol (INAPSINE) 2.5 MG/ML injection 2.5 mg (2.5 mg Intravenous Given 05/04/23 0624)  cefTRIAXone (ROCEPHIN) 2 g in sodium chloride 0.9 % 100 mL IVPB (0 g Intravenous Stopped 05/04/23 0702)  LORazepam (ATIVAN) injection 1 mg (1 mg Intravenous Given 05/04/23 0702)  acetaminophen (TYLENOL) tablet 1,000 mg (1,000 mg Oral Given 05/04/23 0702)     ED COURSE: Chest x-ray reviewed and interpreted by myself and the radiologist and shows a left lower lobe pneumonia.  Will give Rocephin and azithromycin to cover for community-acquired pneumonia.  30 mL/kg IV fluid bolus ordered given patient does meet criteria for sepsis.  Cultures pending.  7:14 AM  Pt repeatedly pressing the call button because she did not feel well after receiving droperidol.  Patient having adverse reaction, akathisia.  She was tachycardic, diaphoretic and felt uncomfortable.  Given Ativan and within 10 minutes patient feeling much better and heart rate down to the 90s.  Discussed chest x-ray findings with husband at bedside.  Labs reassuring.  No leukocytosis or leukopenia.  Normal lactic.  COVID, flu negative.  Patient is not hypoxic here.  Discussed admission versus discharge.  Patient states at this time she would prefer discharge home.  Headache has significantly improved.  Signed out the oncoming ED physician.   CONSULTS: Admission offered but patient declined at this time.   OUTSIDE RECORDS REVIEWED: Reviewed last OB/GYN note on 05/02/2023 for chronic pelvic pain.       FINAL CLINICAL IMPRESSION(S) / ED DIAGNOSES   Final diagnoses:  Pneumonia of left lower lobe due to infectious organism     Rx / DC Orders   ED Discharge Orders     None        Note:  This document was prepared using Dragon voice recognition software and may include unintentional dictation  errors.   Perri Lamagna, Layla Maw, DO 05/04/23 [phone]

## 2023-05-04 NOTE — Sepsis Progress Note (Signed)
Elink monitoring for the code sepsis protocol.  

## 2023-05-04 NOTE — ED Provider Notes (Signed)
-----------------------------------------   8:42 AM on 05/04/2023 -----------------------------------------  I took over care of this patient from Dr. Elesa Massed.  On reassessment, the patient states she is feeling significantly better.  She no longer has a headache.  Lab workup is unremarkable; there is no leukocytosis.  Lactate is normal.  Electrolytes are normal.    The vital signs have remained stable over the last few hours.  On my reassessment, the heart rate was in the 90s.  When awoken, however, the patient's heart rate briefly went up to 140 (sinus tachycardia on the monitor) and then came back down to 100 without any intervention.  The patient apparently has a history of tachycardia like this.  She currently denies any palpitations, chest pain, shortness of breath, or other cardiopulmonary symptoms.  This transient tachycardia is not worrisome in this young patient with a febrile illness.  Especially based on the reassuring labs, I do not believe there is any need for additional fluids.  She feels much better and would like to go home, which I feel is reasonable.  She is stable for discharge at this time.  I counseled the patient and her husband on the results of the workup and plan of care.  I have prescribed an additional 4 days of azithromycin for home.  I gave strict return precautions and they expressed understanding.   Dionne Bucy, MD 05/04/23 [phone]

## 2023-05-04 NOTE — Discharge Instructions (Addendum)
Take the antibiotic as prescribed and finish the full course.  You should start the antibiotic tomorrow, as you already have received the doses needed for today.  May take over-the-counter Tylenol or ibuprofen as needed for fever.  Follow-up with your primary care provider.  Return to the ER for new, worsening, or persistent severe fever, weakness, cough, shortness of breath, chest pain, vomiting, or any other new or worsening symptoms that concern you.

## 2023-05-04 NOTE — ED Triage Notes (Signed)
Pt reports h/a, body aches, stiff muscles, neck pain, fever x 2 days. States seen at [facility] yesterday and neg. For COVID but was told he had upper respiratory virus. Pt extremely tearful. States she cannot walk. Pt was ambulatory from car to wheelchair and independent from wheelchair to bed. Pt alert and oriented. Breathing unlabored.

## 2023-05-09 LAB — CULTURE, BLOOD (ROUTINE X 2): Culture: NO GROWTH

## 2023-05-12 ENCOUNTER — Emergency Department
Admission: EM | Admit: 2023-05-12 | Discharge: 2023-05-12 | Disposition: A | Discharge status: Home / Self Care | Payer: BC Managed Care – PPO | Attending: Emergency Medicine | Admitting: Emergency Medicine

## 2023-05-12 ENCOUNTER — Other Ambulatory Visit: Payer: Self-pay

## 2023-05-12 ENCOUNTER — Emergency Department: Payer: BC Managed Care – PPO

## 2023-05-12 DIAGNOSIS — J189 Pneumonia, unspecified organism: Secondary | ICD-10-CM | POA: Diagnosis not present

## 2023-05-12 DIAGNOSIS — Z20822 Contact with and (suspected) exposure to covid-19: Secondary | ICD-10-CM | POA: Insufficient documentation

## 2023-05-12 DIAGNOSIS — R059 Cough, unspecified: Secondary | ICD-10-CM | POA: Diagnosis present

## 2023-05-12 DIAGNOSIS — J45909 Unspecified asthma, uncomplicated: Secondary | ICD-10-CM | POA: Insufficient documentation

## 2023-05-12 DIAGNOSIS — R051 Acute cough: Secondary | ICD-10-CM | POA: Diagnosis not present

## 2023-05-12 LAB — RESP PANEL BY RT-PCR (RSV, FLU A&B, COVID)  RVPGX2
Influenza A by PCR: NEGATIVE
Influenza B by PCR: NEGATIVE
Resp Syncytial Virus by PCR: NEGATIVE
SARS Coronavirus 2 by RT PCR: NEGATIVE

## 2023-05-12 LAB — BASIC METABOLIC PANEL
Anion gap: 9 (ref 5–15)
BUN: 8 mg/dL (ref 6–20)
CO2: 25 mmol/L (ref 22–32)
Calcium: 9.2 mg/dL (ref 8.9–10.3)
Chloride: 102 mmol/L (ref 98–111)
Creatinine, Ser: 0.64 mg/dL (ref 0.44–1.00)
GFR, Estimated: >60 mL/min (ref >60)
Glucose, Bld: 94 mg/dL (ref 70–99)
Potassium: 3.4 mmol/L — ABNORMAL LOW (ref 3.5–5.1)
Sodium: 136 mmol/L (ref 135–145)

## 2023-05-12 LAB — GROUP A STREP BY PCR: Group A Strep by PCR: NOT DETECTED

## 2023-05-12 LAB — CBC
HCT: 40.9 % (ref 36.0–46.0)
Hemoglobin: 13.7 g/dL (ref 12.0–15.0)
MCH: 28.9 pg (ref 26.0–34.0)
MCHC: 33.5 g/dL (ref 30.0–36.0)
MCV: 86.3 fL (ref 80.0–100.0)
Platelets: 369 10*3/uL (ref 150–400)
RBC: 4.74 MIL/uL (ref 3.87–5.11)
RDW: 12.8 % (ref 11.5–15.5)
WBC: 11.4 10*3/uL — ABNORMAL HIGH (ref 4.0–10.5)
nRBC: 0 % (ref 0.0–0.2)

## 2023-05-12 MED ORDER — AZITHROMYCIN 250 MG PO TABS: 250.0000 mg | ORAL_TABLET | Freq: Every day | ORAL | 0 refills | Status: AC

## 2023-05-12 MED ORDER — CEFDINIR 300 MG PO CAPS: 300.0000 mg | ORAL_CAPSULE | Freq: Two times a day (BID) | ORAL | 0 refills | Status: AC

## 2023-05-12 NOTE — ED Provider Notes (Signed)
[facility] Provider Note   Event Date/Time   First MD Initiated Contact with Patient 05/12/23 1113     (approximate) History  Cough  HPI Karen Huang is a 27 y.o. female with a stated past medical history of asthma, anxiety/depression, and seasonal allergies who presents complaining of fever 202 F, cough, and mild left chest pain.  Patient states that she has been dealing with the symptoms for the past week after being diagnosed with a left-sided pneumonia and being started on azithromycin for 4 days.  Patient states that her symptoms initially improved however began to recur after the antibiotic prescription had ceased.  Patient states that she has been coughing productive of yellow sputum. ROS: Patient currently denies any vision changes, tinnitus, difficulty speaking, facial droop, sore throat, chest pain, abdominal pain, nausea/vomiting/diarrhea, dysuria, or weakness/numbness/paresthesias in any extremity   Physical Exam  Triage Vital Signs: ED Triage Vitals  Encounter Vitals Group     BP 05/12/23 1106 (!) 137/91     Systolic BP Percentile --      Diastolic BP Percentile --      Pulse Rate 05/12/23 1106 (!) 109     Resp 05/12/23 1106 18     Temp 05/12/23 1106 97.7 F (36.5 C)     Temp Source 05/12/23 1106 Oral     SpO2 05/12/23 1106 97 %     Weight 05/12/23 1107 134 lb 14.7 oz (61.2 kg)     Height 05/12/23 1107 5\' 6"  (1.676 m)     Head Circumference --      Peak Flow --      Pain Score 05/12/23 1107 7     Pain Loc --      Pain Education --      Exclude from Growth Chart --    Most recent vital signs: Vitals:   05/12/23 1106  BP: (!) 137/91  Pulse: (!) 109  Resp: 18  Temp: 97.7 F (36.5 C)  SpO2: 97%   General: Awake, oriented x4. CV:  Good peripheral perfusion.  Resp:  Normal effort.  Abd:  No distention.  Other:  Young adult well-developed, well-nourished Caucasian female resting comfortably in no acute distress ED Results /  Procedures / Treatments  Labs (all labs ordered are listed, but only abnormal results are displayed) Labs Reviewed  BASIC METABOLIC PANEL - Abnormal; Notable for the following components:      Result Value   Potassium 3.4 (*)    All other components within normal limits  CBC - Abnormal; Notable for the following components:   WBC 11.4 (*)    All other components within normal limits  RESP PANEL BY RT-PCR (RSV, FLU A&B, COVID)  [id number]  GROUP A STREP BY PCR  POC URINE PREG, ED   EKG ED ECG REPORT I, Merwyn Katos, the attending physician, personally viewed and interpreted this ECG. Date: 05/12/2023 EKG Time: 1118 Rate: 90 Rhythm: normal sinus rhythm QRS Axis: normal Intervals: normal ST/T Wave abnormalities: normal Narrative Interpretation: no evidence of acute ischemia RADIOLOGY ED MD interpretation: 2 view chest x-ray interpreted by me shows likely evidence of persistent but improved consolidation in the left lower lobe.  No evidence of abnormalities including no pneumothorax, or widened mediastinum -Agree with radiology assessment Official radiology report(s): DG Chest 2 View  Result Date: 05/12/2023 CLINICAL DATA:  Cough. EXAM: CHEST - 2 VIEW COMPARISON:  None Available. FINDINGS: The heart size and mediastinal contours are within normal limits. No consolidation,  pneumothorax or effusion. No edema. The visualized skeletal structures are unremarkable. IMPRESSION: No acute cardiopulmonary disease. Electronically Signed   By: Karen Kays M.D.   On: 05/12/2023 12:14   PROCEDURES: Critical Care performed: No Procedures MEDICATIONS ORDERED IN ED: Medications - No data to display IMPRESSION / MDM / ASSESSMENT AND PLAN / ED COURSE  I reviewed the triage vital signs and the nursing notes.                             The patient is on the cardiac monitor to evaluate for evidence of arrhythmia and/or significant heart rate changes. Patient's presentation is most consistent with  acute presentation with potential threat to life or bodily function. Presents with fever, shortness of breath, cough, and malaise concerning for pneumonia. DDx: PE, COPD exacerbation, Pneumothorax, TB, Atypical ACS, Esophageal Rupture, Toxic Exposure, Foreign Body Airway Obstruction. Workup: CXR CBC, CMP, Trop, Lactate  Given History, Exam, and Workup presentation most consistent with pneumonia.  Findings:  Single Focus Consolidation on CXR from previous visit.  This focal consolidation has improved but is not completely gone  Rx: Azithromycin and cefdinir  1255 Reassessment: Patient resting comfortably in no respiratory distress.  Patient not requiring any supplemental oxygen and agrees with plan for discharge home.  Disposition: Discharge home. Return precautions discussed at bedside and patient in agreement with plan. Prompt follow up with primary care provider advised.   FINAL CLINICAL IMPRESSION(S) / ED DIAGNOSES   Final diagnoses:  Acute cough  Pulmonary infection   Rx / DC Orders   ED Discharge Orders          Ordered    cefdinir (OMNICEF) 300 MG capsule  2 times daily        05/12/23 1302    azithromycin (ZITHROMAX) 250 MG tablet  Daily        05/12/23 1302           Note:  This document was prepared using Dragon voice recognition software and may include unintentional dictation errors.   Merwyn Katos, MD 05/12/23 1322

## 2023-05-12 NOTE — ED Triage Notes (Signed)
Pt here with flu like symptoms. Pt states she was here recently with PNA and finished her abx but is still having the same symptoms. Pt denies fever at home.

## 2023-05-15 NOTE — Group Note (Deleted)

## 2023-08-07 ENCOUNTER — Ambulatory Visit
Admission: EM | Admit: 2023-08-07 | Discharge: 2023-08-07 | Disposition: A | Discharge status: Home / Self Care | Payer: BC Managed Care – PPO

## 2023-08-07 ENCOUNTER — Encounter: Payer: Self-pay | Admitting: *Deleted

## 2023-08-07 ENCOUNTER — Ambulatory Visit: Payer: BC Managed Care – PPO

## 2023-08-07 DIAGNOSIS — J069 Acute upper respiratory infection, unspecified: Secondary | ICD-10-CM | POA: Diagnosis not present

## 2023-08-07 NOTE — ED Triage Notes (Signed)
Cough/congestion, sore throat for 2 days.  Taking OTC Mucinex and pain meds with little relief

## 2023-08-07 NOTE — Discharge Instructions (Signed)
Your symptoms today are most likely being caused by a virus and should steadily improve in time it can take up to 7 to 10 days before you truly start to see a turnaround however things will get better  X-rays pending, you will be notified of results via telephone and we will discuss treatment further at that time  Begin prednisone as directed by your primary doctor    You can take Tylenol and/or Ibuprofen as needed for fever reduction and pain relief.   For cough: honey 1/2 to 1 teaspoon (you can dilute the honey in water or another fluid).  You can also use guaifenesin and dextromethorphan for cough. You can use a humidifier for chest congestion and cough.  If you don't have a humidifier, you can sit in the bathroom with the hot shower running.      For sore throat: try warm salt water gargles, cepacol lozenges, throat spray, warm tea or water with lemon/honey, popsicles or ice, or OTC cold relief medicine for throat discomfort.   For congestion: take a daily anti-histamine like Zyrtec, Claritin, and a oral decongestant, such as pseudoephedrine.  You can also use Flonase 1-2 sprays in each nostril daily.   It is important to stay hydrated: drink plenty of fluids (water, gatorade/powerade/pedialyte, juices, or teas) to keep your throat moisturized and help further relieve irritation/discomfort.

## 2023-08-07 NOTE — ED Provider Notes (Signed)
Karen Huang    CSN: [id number] Arrival date & time: 08/07/23  1521      History   Chief Complaint Chief Complaint  Patient presents with   Cough   Sore Throat    HPI Karen Huang is a 27 y.o. female.   Patient presents for evaluation of nasal congestion, sore throat, bilateral fullness and itching, a nonproductive cough, intermittent generalized headaches and shortness of breath with exertion present for 2 days.  Known sick contact with similar symptoms within household, endorses her child has exposure to RSV pneumonia.  Tolerating food and liquids.  Has attempted use of Tylenol, ibuprofen and Mucinex.  Endorses history of pneumonia in symptoms felt similar.  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   Viral upper respiratory tract infection 02/21/2020   Wheezing 02/21/2020   Hepatitis C antibody test positive 11/28/2018   PIH (pregnancy induced hypertension), third trimester 06/21/2018   SVD (spontaneous vaginal delivery) 06/21/2018   Postpartum care following vaginal delivery 06/21/2018    Past Surgical History:  Procedure Laterality Date   TONSILLECTOMY      OB History     Gravida  1   Para  1   Term  1   Preterm  0   AB  0   Living  1      SAB  0   IAB  0   Ectopic  0   Multiple  0   Live Births  1            Home Medications    Prior to Admission  medications   Medication Sig Start Date End Date Taking? Authorizing Provider  ALPRAZolam Prudy Feeler) 0.5 MG tablet Take 0.5 mg by mouth 2 (two) times daily as needed.    [provider]  ALPRAZolam Prudy Feeler) 1 MG tablet Take 1 mg by mouth 2 (two) times daily.    [provider]  amphetamine-dextroamphetamine (ADDERALL) 10 MG tablet Take 10 mg by mouth.    [provider]  FLUoxetine (PROZAC) 20 MG capsule Take 20 mg by mouth daily.    [provider]  FLUoxetine (PROZAC) 40 MG capsule Take 40 mg by mouth daily.    [provider]  ibuprofen (ADVIL) 400 MG tablet Take 1 tablet (400 mg total) by mouth 3 (three) times daily. 10/28/21   Kara Dies, NP  prazosin (MINIPRESS) 1 MG capsule Take 1 mg by mouth at bedtime. 10/22/21   [provider]  sertraline (ZOLOFT) 50 MG tablet Take 50 mg by mouth daily. Take 1 1/2 tablet daily    [provider]    Family History Family History  Problem Relation Age of Onset   Hypertension Father    Heart Problems Father    Hypertension Sister    Hypertension Paternal Grandmother    COPD Paternal Grandmother    Diabetes Paternal Grandmother  Heart Problems Paternal Grandmother    Alcohol abuse Mother    ADD / ADHD Brother    COPD Maternal Grandmother     Social History Social History   Tobacco Use   Smoking status: Some Days    Types: E-cigarettes    Last attempt to quit: 11/17/2017    Years since quitting: 5.7   Smokeless tobacco: Never  Vaping Use   Vaping status: Every Day   Last attempt to quit: 11/03/2017  Substance Use Topics   Alcohol use: No   Drug use: No     Allergies   Amoxicillin and Droperidol   Review of Systems Review of Systems   Physical Exam Triage Vital Signs ED Triage Vitals  Encounter Vitals Group     BP 08/07/23 1650 117/80     Systolic BP Percentile --      Diastolic BP Percentile --      Pulse Rate 08/07/23 1650 95     Resp 08/07/23 1650 20      Temp 08/07/23 1650 99.3 F (37.4 C)     Temp Source 08/07/23 1650 Oral     SpO2 08/07/23 1650 97 %     Weight 08/07/23 1647 135 lb (61.2 kg)     Height 08/07/23 1647 5\' 5"  (1.651 m)     Head Circumference --      Peak Flow --      Pain Score 08/07/23 1646 5     Pain Loc --      Pain Education --      Exclude from Growth Chart --    No data found.  Updated Vital Signs BP 117/80 (BP Location: Left Arm)   Pulse 95   Temp 99.3 F (37.4 C) (Oral)   Resp 20   Ht 5\' 5"  (1.651 m)   Wt 135 lb (61.2 kg)   LMP 07/15/2023 (Exact Date)   SpO2 97%   BMI 22.47 kg/m   Visual Acuity Right Eye Distance:   Left Eye Distance:   Bilateral Distance:    Right Eye Near:   Left Eye Near:    Bilateral Near:     Physical Exam Constitutional:      Appearance: She is ill-appearing.  HENT:     Head: Normocephalic.     Right Ear: Tympanic membrane, ear canal and external ear normal.     Left Ear: Tympanic membrane, ear canal and external ear normal.     Nose: Congestion present. No rhinorrhea.     Mouth/Throat:     Mouth: Mucous membranes are moist.     Pharynx: Oropharynx is clear. No oropharyngeal exudate or posterior oropharyngeal erythema.  Eyes:     Extraocular Movements: Extraocular movements intact.  Cardiovascular:     Rate and Rhythm: Normal rate and regular rhythm.     Pulses: Normal pulses.     Heart sounds: Normal heart sounds.  Pulmonary:     Effort: Pulmonary effort is normal.     Breath sounds: Normal breath sounds.  Musculoskeletal:     Cervical back: Normal range of motion and neck supple.  Skin:    General: Skin is warm and dry.  Neurological:     Mental Status: She is alert and oriented to person, place, and time. Mental status is at baseline.      UC Treatments / Results  Labs (all labs ordered are listed, but only abnormal results are displayed) Labs Reviewed - No data to display  EKG   Radiology  No results found.  Procedures Procedures  (including critical care time)  Medications Ordered in UC Medications - No data to display  Initial Impression / Assessment and Plan / UC Course  I have reviewed the triage vital signs and the nursing notes.  Pertinent labs & imaging results that were available during my care of the patient were reviewed by me and considered in my medical decision making (see chart for details).  Viral URI with cough  Patient is in no signs of distress nor toxic appearing.  Vital signs are stable.  Low suspicion for pneumonia, pneumothorax or bronchitis however patient endorses similar symptoms with pneumonia earlier this year. Chest x-ray pending.Was prescribed prednisone by PCP, encouraged her to start taking. May use additional over-the-counter medications as needed for supportive care.  May follow-up with urgent care as needed if symptoms persist or worsen.  Note given.   Final Clinical Impressions(s) / UC Diagnoses   Final diagnoses:  Viral URI with cough     Discharge Instructions      Your symptoms today are most likely being caused by a virus and should steadily improve in time it can take up to 7 to 10 days before you truly start to see a turnaround however things will get better  X-rays pending, you will be notified of results via telephone and we will discuss treatment further at that time  Begin prednisone as directed by your primary doctor    You can take Tylenol and/or Ibuprofen as needed for fever reduction and pain relief.   For cough: honey 1/2 to 1 teaspoon (you can dilute the honey in water or another fluid).  You can also use guaifenesin and dextromethorphan for cough. You can use a humidifier for chest congestion and cough.  If you don't have a humidifier, you can sit in the bathroom with the hot shower running.      For sore throat: try warm salt water gargles, cepacol lozenges, throat spray, warm tea or water with lemon/honey, popsicles or ice, or OTC cold relief medicine for  throat discomfort.   For congestion: take a daily anti-histamine like Zyrtec, Claritin, and a oral decongestant, such as pseudoephedrine.  You can also use Flonase 1-2 sprays in each nostril daily.   It is important to stay hydrated: drink plenty of fluids (water, gatorade/powerade/pedialyte, juices, or teas) to keep your throat moisturized and help further relieve irritation/discomfort.    ED Prescriptions   None    PDMP not reviewed this encounter.   Valinda Hoar, NP 08/07/23 [phone]

## 2023-08-08 ENCOUNTER — Telehealth: Payer: Self-pay | Admitting: Emergency Medicine

## 2023-08-08 NOTE — Telephone Encounter (Signed)
Reported chest x-ray results via telephone to patient, 2 patient identifiers used, recommended continue treatment plan as discussed
